# Patient Record
Sex: Female | Born: 1955 | Race: White | Hispanic: No | Marital: Single | Smoking: Light tobacco smoker
Health system: Southern US, Community
[De-identification: ages and names within clinical notes are randomized; demographics above are authoritative.]

## PROBLEM LIST (undated history)

## (undated) DIAGNOSIS — K529 Noninfective gastroenteritis and colitis, unspecified: Secondary | ICD-10-CM

## (undated) DIAGNOSIS — I10 Essential (primary) hypertension: Secondary | ICD-10-CM

## (undated) DIAGNOSIS — C801 Malignant (primary) neoplasm, unspecified: Secondary | ICD-10-CM

## (undated) DIAGNOSIS — B1920 Unspecified viral hepatitis C without hepatic coma: Secondary | ICD-10-CM

## (undated) DIAGNOSIS — E119 Type 2 diabetes mellitus without complications: Secondary | ICD-10-CM

## (undated) DIAGNOSIS — F419 Anxiety disorder, unspecified: Secondary | ICD-10-CM

## (undated) DIAGNOSIS — K219 Gastro-esophageal reflux disease without esophagitis: Secondary | ICD-10-CM

## (undated) HISTORY — DX: Essential (primary) hypertension: I10

## (undated) HISTORY — DX: Noninfective gastroenteritis and colitis, unspecified: K52.9

## (undated) HISTORY — DX: Unspecified viral hepatitis C without hepatic coma: B19.20

## (undated) HISTORY — DX: Anxiety disorder, unspecified: F41.9

## (undated) HISTORY — DX: Gastro-esophageal reflux disease without esophagitis: K21.9

## (undated) HISTORY — DX: Malignant (primary) neoplasm, unspecified: C80.1

## (undated) HISTORY — DX: Type 2 diabetes mellitus without complications: E11.9

---

## 2008-03-08 ENCOUNTER — Emergency Department: Payer: Self-pay | Admitting: Internal Medicine

## 2008-04-22 ENCOUNTER — Emergency Department (HOSPITAL_COMMUNITY): Admission: EM | Admit: 2008-04-22 | Discharge: 2008-04-22 | Payer: Self-pay | Admitting: Emergency Medicine

## 2008-05-18 ENCOUNTER — Inpatient Hospital Stay: Payer: Self-pay | Admitting: Internal Medicine

## 2008-05-19 ENCOUNTER — Ambulatory Visit: Payer: Self-pay | Admitting: Cardiology

## 2008-06-11 ENCOUNTER — Ambulatory Visit (HOSPITAL_COMMUNITY): Admission: RE | Admit: 2008-06-11 | Discharge: 2008-06-11 | Payer: Self-pay | Admitting: Family Medicine

## 2009-08-18 ENCOUNTER — Ambulatory Visit: Payer: Self-pay | Admitting: Internal Medicine

## 2009-08-26 ENCOUNTER — Ambulatory Visit: Payer: Self-pay | Admitting: Adult Health

## 2009-10-24 ENCOUNTER — Inpatient Hospital Stay: Payer: Self-pay | Admitting: Psychiatry

## 2010-12-25 ENCOUNTER — Emergency Department: Payer: Self-pay | Admitting: Emergency Medicine

## 2013-07-04 ENCOUNTER — Emergency Department: Payer: Self-pay | Admitting: Emergency Medicine

## 2014-10-24 ENCOUNTER — Ambulatory Visit: Payer: Self-pay

## 2014-11-28 HISTORY — PX: FINGER SURGERY: SHX640

## 2015-01-02 ENCOUNTER — Ambulatory Visit: Payer: Self-pay

## 2015-01-23 ENCOUNTER — Other Ambulatory Visit: Payer: Self-pay

## 2015-01-23 LAB — BASIC METABOLIC PANEL
BUN: 14 mg/dL (ref 4–21)
CREATININE: 1 mg/dL (ref 0.5–1.1)
GLUCOSE: 213 mg/dL
POTASSIUM: 4.2 mmol/L (ref 3.4–5.3)
SODIUM: 135 mmol/L — AB (ref 137–147)

## 2015-01-23 LAB — CBC AND DIFFERENTIAL
HCT: 34 % — AB (ref 36–46)
Hemoglobin: 11.3 g/dL — AB (ref 12.0–16.0)
Neutrophils Absolute: 47 /uL
Platelets: 186 10*3/uL (ref 150–399)
WBC: 7.7 10^3/mL

## 2015-01-23 LAB — HEPATIC FUNCTION PANEL
ALT: 12 U/L (ref 7–35)
AST: 14 U/L (ref 13–35)
Alkaline Phosphatase: 75 U/L (ref 25–125)
BILIRUBIN, TOTAL: 0.2 mg/dL

## 2015-01-23 LAB — LIPID PANEL
Cholesterol: 195 mg/dL (ref 0–200)
HDL: 64 mg/dL (ref 35–70)
LDL CALC: 101 mg/dL
TRIGLYCERIDES: 150 mg/dL (ref 40–160)

## 2015-01-23 LAB — HEMOGLOBIN A1C: HEMOGLOBIN A1C: 8.9

## 2015-01-30 ENCOUNTER — Ambulatory Visit: Payer: Self-pay

## 2015-03-13 ENCOUNTER — Ambulatory Visit: Payer: Self-pay

## 2015-04-29 ENCOUNTER — Ambulatory Visit: Payer: Self-pay

## 2015-05-12 DIAGNOSIS — Z794 Long term (current) use of insulin: Principal | ICD-10-CM

## 2015-05-12 DIAGNOSIS — E119 Type 2 diabetes mellitus without complications: Secondary | ICD-10-CM | POA: Insufficient documentation

## 2015-05-12 DIAGNOSIS — K219 Gastro-esophageal reflux disease without esophagitis: Secondary | ICD-10-CM | POA: Insufficient documentation

## 2015-05-12 DIAGNOSIS — B1920 Unspecified viral hepatitis C without hepatic coma: Secondary | ICD-10-CM | POA: Insufficient documentation

## 2015-05-12 DIAGNOSIS — I10 Essential (primary) hypertension: Secondary | ICD-10-CM | POA: Insufficient documentation

## 2015-05-22 ENCOUNTER — Other Ambulatory Visit: Payer: Self-pay

## 2015-05-29 ENCOUNTER — Ambulatory Visit: Payer: Self-pay

## 2015-06-26 ENCOUNTER — Ambulatory Visit: Payer: Self-pay

## 2015-10-07 ENCOUNTER — Other Ambulatory Visit: Payer: Self-pay

## 2015-10-07 DIAGNOSIS — E119 Type 2 diabetes mellitus without complications: Secondary | ICD-10-CM

## 2015-10-07 DIAGNOSIS — Z794 Long term (current) use of insulin: Secondary | ICD-10-CM

## 2015-10-07 DIAGNOSIS — B1920 Unspecified viral hepatitis C without hepatic coma: Secondary | ICD-10-CM

## 2015-10-07 DIAGNOSIS — I10 Essential (primary) hypertension: Secondary | ICD-10-CM

## 2015-10-08 LAB — HEPATIC FUNCTION PANEL
ALT: 13 IU/L (ref 0–32)
AST: 16 IU/L (ref 0–40)
Albumin: 4.2 g/dL (ref 3.6–4.8)
Alkaline Phosphatase: 68 IU/L (ref 39–117)
Bilirubin Total: <0.2 mg/dL (ref 0.0–1.2)
Bilirubin, Direct: 0.07 mg/dL (ref 0.00–0.40)
TOTAL PROTEIN: 6.9 g/dL (ref 6.0–8.5)

## 2015-10-08 LAB — LIPID PANEL
CHOL/HDL RATIO: 3.4 ratio (ref 0.0–4.4)
CHOLESTEROL TOTAL: 196 mg/dL (ref 100–199)
HDL: 57 mg/dL (ref >39)
LDL CALC: 102 mg/dL — AB (ref 0–99)
Triglycerides: 187 mg/dL — ABNORMAL HIGH (ref 0–149)
VLDL Cholesterol Cal: 37 mg/dL (ref 5–40)

## 2015-10-08 LAB — BASIC METABOLIC PANEL
BUN / CREAT RATIO: 13 (ref 12–28)
BUN: 15 mg/dL (ref 8–27)
CO2: 29 mmol/L (ref 18–29)
CREATININE: 1.12 mg/dL — AB (ref 0.57–1.00)
Calcium: 9.1 mg/dL (ref 8.7–10.3)
Chloride: 100 mmol/L (ref 96–106)
GFR calc non Af Amer: 54 mL/min/{1.73_m2} — ABNORMAL LOW (ref >59)
GFR, EST AFRICAN AMERICAN: 62 mL/min/{1.73_m2} (ref >59)
GLUCOSE: 283 mg/dL — AB (ref 65–99)
Potassium: 4.1 mmol/L (ref 3.5–5.2)
SODIUM: 139 mmol/L (ref 134–144)

## 2015-10-08 LAB — CBC WITH DIFFERENTIAL/PLATELET
BASOS ABS: 0 10*3/uL (ref 0.0–0.2)
BASOS: 0 %
EOS (ABSOLUTE): 0.2 10*3/uL (ref 0.0–0.4)
EOS: 2 %
HEMOGLOBIN: 10.6 g/dL — AB (ref 11.1–15.9)
Hematocrit: 32.3 % — ABNORMAL LOW (ref 34.0–46.6)
Immature Grans (Abs): 0 10*3/uL (ref 0.0–0.1)
Immature Granulocytes: 0 %
LYMPHS ABS: 3.1 10*3/uL (ref 0.7–3.1)
Lymphs: 41 %
MCH: 30.3 pg (ref 26.6–33.0)
MCHC: 32.8 g/dL (ref 31.5–35.7)
MCV: 92 fL (ref 79–97)
MONOCYTES: 10 %
Monocytes Absolute: 0.7 10*3/uL (ref 0.1–0.9)
NEUTROS ABS: 3.5 10*3/uL (ref 1.4–7.0)
NEUTROS PCT: 47 %
Platelets: 198 10*3/uL (ref 150–379)
RBC: 3.5 x10E6/uL — ABNORMAL LOW (ref 3.77–5.28)
RDW: 12.8 % (ref 12.3–15.4)
WBC: 7.5 10*3/uL (ref 3.4–10.8)

## 2015-10-08 LAB — HEMOGLOBIN A1C
ESTIMATED AVERAGE GLUCOSE: 235 mg/dL
HEMOGLOBIN A1C: 9.8 % — AB (ref 4.8–5.6)

## 2015-10-16 ENCOUNTER — Ambulatory Visit: Payer: Self-pay | Admitting: Urology

## 2015-10-16 VITALS — BP 127/75 | HR 72 | Wt 166.0 lb

## 2015-10-16 DIAGNOSIS — E119 Type 2 diabetes mellitus without complications: Secondary | ICD-10-CM

## 2015-10-16 DIAGNOSIS — Z794 Long term (current) use of insulin: Principal | ICD-10-CM

## 2015-10-16 LAB — GLUCOSE, POCT (MANUAL RESULT ENTRY): POC GLUCOSE: 214 mg/dL — AB (ref 70–99)

## 2015-10-16 MED ORDER — LISINOPRIL 20 MG PO TABS: 20.0000 mg | ORAL_TABLET | Freq: Every day | ORAL | Status: DC

## 2015-10-16 MED ORDER — GABAPENTIN 300 MG PO CAPS: ORAL_CAPSULE | ORAL | Status: DC

## 2015-10-16 NOTE — Progress Notes (Signed)
  Patient: Robin Moody Female    DOB: January 31, 1956   60 y.o.   MRN: [id number] Visit Date: 10/16/2015  Today's Provider: [place]   Chief Complaint  Patient presents with  . Leg Pain    left leg  . Diabetes   Subjective:    HPI Leg started to hurt one month ago.  Pain starts in the left hip and radiates down the lateral side of her leg and calf.  She describes the pain as throbbing.  Standing makes it worse.  Ibuprofen has not helped.  Stretching exerces not helping.    HbgA1c has increased. Patient admits to eating more candy at night.     No Known Allergies Previous Medications   AMLODIPINE (NORVASC) 5 MG TABLET    Take 5 mg by mouth daily.   CITALOPRAM (CELEXA) 20 MG TABLET    Take 20 mg by mouth daily.   GLIPIZIDE (GLUCOTROL) 5 MG TABLET    Take by mouth 2 (two) times daily before a meal.   INSULIN GLARGINE (LANTUS [place])    Inject 10 Units into the skin daily. Inject 10 units under the skin every evening.   LISINOPRIL (PRINIVIL,ZESTRIL) 20 MG TABLET    Take 20 mg by mouth daily.   METOPROLOL SUCCINATE (TOPROL-XL) 50 MG 24 HR TABLET    Take 50 mg by mouth 2 (two) times daily. Take with or immediately following a meal.   MIRTAZAPINE (REMERON) 30 MG TABLET    Take 30 mg by mouth at bedtime.   RANITIDINE (ZANTAC) 150 MG TABLET    Take 150 mg by mouth 2 (two) times daily.   SITAGLIPTIN (JANUVIA) 50 MG TABLET    Take 50 mg by mouth daily.    Review of Systems  Social History  Substance Use Topics  . Smoking status: Never Smoker   . Smokeless tobacco: Not on file  . Alcohol Use: Not on file   Objective:   BP 127/75 mmHg  Pulse 72  Wt 166 lb (75.297 kg)  Physical Exam      Assessment & Plan:     1. Sciatica-  Needs to see Dr. Vickki Hearing  Start gabapentin 300 mg at night     2. Anemia-  Give stool card  3. Increased creatinine  Dicussed sticking to DM diet and good sugar control  4. HTN  Patient states she has been taking her lisinopril twice daily,  refill corrected   [facility] [place] [facility]

## 2015-10-23 ENCOUNTER — Other Ambulatory Visit: Payer: Self-pay

## 2015-10-23 MED ORDER — LISINOPRIL 20 MG PO TABS: 20.0000 mg | ORAL_TABLET | Freq: Every day | ORAL | 0 refills | Status: AC

## 2015-10-29 ENCOUNTER — Encounter: Payer: Self-pay | Admitting: Specialist

## 2015-11-20 ENCOUNTER — Ambulatory Visit: Payer: Self-pay | Admitting: Nurse Practitioner

## 2015-11-20 VITALS — BP 130/79 | HR 68 | Wt 165.0 lb

## 2015-11-20 DIAGNOSIS — E119 Type 2 diabetes mellitus without complications: Secondary | ICD-10-CM

## 2015-11-20 DIAGNOSIS — E782 Mixed hyperlipidemia: Secondary | ICD-10-CM

## 2015-11-20 DIAGNOSIS — D508 Other iron deficiency anemias: Secondary | ICD-10-CM

## 2015-11-20 DIAGNOSIS — Z794 Long term (current) use of insulin: Principal | ICD-10-CM

## 2015-11-20 DIAGNOSIS — I10 Essential (primary) hypertension: Secondary | ICD-10-CM

## 2015-11-20 LAB — GLUCOSE, POCT (MANUAL RESULT ENTRY): POC GLUCOSE: 268 mg/dL — AB (ref 70–99)

## 2015-11-20 NOTE — Progress Notes (Signed)
Here tonight to determine results from stool sample   A1c elevated to 9.8 which is an average daily blood sugar of 241  States she has "cut out" the sweets, and her blood sugars are measuring much better, though in morning at or < 200  Quit smoking approx 3 years ago    Plan:    Will not change pt's diabetes regimen at this time for fear of hypoglycemia, if she makes all the necessary changes.  Will have pt keep her next appointment and labs will be drawn before her next appointment.    Will also have a scheduled appointment after 3 month labs are drawn  Will have administrative staff assist with obtaining results of the stool test, as they are  Not available in the chart

## 2015-11-24 ENCOUNTER — Other Ambulatory Visit: Payer: Self-pay | Admitting: Urology

## 2015-12-30 ENCOUNTER — Ambulatory Visit: Payer: Self-pay

## 2016-02-11 ENCOUNTER — Telehealth: Payer: Self-pay | Admitting: Pharmacist

## 2016-02-11 NOTE — Telephone Encounter (Signed)
LVM yesterday for pt and she called back this am. S/w pt regarding her Lantus. She has not picked it up from our pharmacy since April '17. She stated she only uses 10 units and keeps it in the fridge b/w doses. I explained that after 28 days she should discard the remainder. The vial are good through the date on the vial as long as they are stored in the fridge and unopened. Once the vial is punctured they expire after 28 days.  Pt v/u. She has 5 vials left in the fridge and does not need a refill at this time. She will toss current vial and start with a new one.

## 2016-02-26 ENCOUNTER — Other Ambulatory Visit: Payer: Self-pay

## 2016-03-02 ENCOUNTER — Other Ambulatory Visit: Payer: Self-pay

## 2016-03-02 DIAGNOSIS — I10 Essential (primary) hypertension: Secondary | ICD-10-CM

## 2016-03-02 DIAGNOSIS — E782 Mixed hyperlipidemia: Secondary | ICD-10-CM

## 2016-03-02 DIAGNOSIS — Z794 Long term (current) use of insulin: Secondary | ICD-10-CM

## 2016-03-02 DIAGNOSIS — E119 Type 2 diabetes mellitus without complications: Secondary | ICD-10-CM

## 2016-03-02 DIAGNOSIS — D508 Other iron deficiency anemias: Secondary | ICD-10-CM

## 2016-03-03 LAB — HEMOGLOBIN A1C
Est. average glucose Bld gHb Est-mCnc: 278 mg/dL
HEMOGLOBIN A1C: 11.3 % — AB (ref 4.8–5.6)

## 2016-03-03 LAB — CBC
Hematocrit: 33 % — ABNORMAL LOW (ref 34.0–46.6)
Hemoglobin: 10.9 g/dL — ABNORMAL LOW (ref 11.1–15.9)
MCH: 29.9 pg (ref 26.6–33.0)
MCHC: 33 g/dL (ref 31.5–35.7)
MCV: 90 fL (ref 79–97)
PLATELETS: 202 10*3/uL (ref 150–379)
RBC: 3.65 x10E6/uL — ABNORMAL LOW (ref 3.77–5.28)
RDW: 12.9 % (ref 12.3–15.4)
WBC: 7.8 10*3/uL (ref 3.4–10.8)

## 2016-03-03 LAB — LIPID PANEL
CHOLESTEROL TOTAL: 184 mg/dL (ref 100–199)
Chol/HDL Ratio: 3.1 ratio units (ref 0.0–4.4)
HDL: 60 mg/dL (ref >39)
LDL CALC: 95 mg/dL (ref 0–99)
TRIGLYCERIDES: 144 mg/dL (ref 0–149)
VLDL CHOLESTEROL CAL: 29 mg/dL (ref 5–40)

## 2016-03-03 LAB — COMPREHENSIVE METABOLIC PANEL
ALK PHOS: 74 IU/L (ref 39–117)
ALT: 12 IU/L (ref 0–32)
AST: 17 IU/L (ref 0–40)
Albumin/Globulin Ratio: 1.4 (ref 1.2–2.2)
Albumin: 4.2 g/dL (ref 3.6–4.8)
BUN/Creatinine Ratio: 20 (ref 12–28)
BUN: 18 mg/dL (ref 8–27)
CHLORIDE: 95 mmol/L — AB (ref 96–106)
CO2: 25 mmol/L (ref 18–29)
CREATININE: 0.91 mg/dL (ref 0.57–1.00)
Calcium: 9 mg/dL (ref 8.7–10.3)
GFR calc Af Amer: 79 mL/min/{1.73_m2} (ref >59)
GFR calc non Af Amer: 68 mL/min/{1.73_m2} (ref >59)
GLUCOSE: 291 mg/dL — AB (ref 65–99)
Globulin, Total: 3 g/dL (ref 1.5–4.5)
Potassium: 4.1 mmol/L (ref 3.5–5.2)
SODIUM: 136 mmol/L (ref 134–144)
Total Protein: 7.2 g/dL (ref 6.0–8.5)

## 2016-03-03 LAB — IRON AND TIBC
Iron Saturation: 15 % (ref 15–55)
Iron: 48 ug/dL (ref 27–139)
TIBC: 317 ug/dL (ref 250–450)
UIBC: 269 ug/dL (ref 118–369)

## 2016-03-03 LAB — FERRITIN: Ferritin: 80 ng/mL (ref 15–150)

## 2016-03-04 ENCOUNTER — Ambulatory Visit: Payer: Self-pay

## 2016-03-09 ENCOUNTER — Telehealth: Payer: Self-pay

## 2016-03-09 NOTE — Telephone Encounter (Signed)
Rec'd PAP application from [facility] for Lantus Vials 10 units.

## 2016-03-10 NOTE — Telephone Encounter (Signed)
Returned PAP to [facility].  Verified signatures.

## 2016-03-11 ENCOUNTER — Ambulatory Visit: Payer: Self-pay | Admitting: Urology

## 2016-03-11 VITALS — BP 132/86 | HR 76 | Temp 98.0°F | Wt 165.0 lb

## 2016-03-11 DIAGNOSIS — E119 Type 2 diabetes mellitus without complications: Secondary | ICD-10-CM

## 2016-03-11 DIAGNOSIS — Z794 Long term (current) use of insulin: Principal | ICD-10-CM

## 2016-03-11 LAB — GLUCOSE, POCT (MANUAL RESULT ENTRY): POC GLUCOSE: 238 mg/dL — AB (ref 70–99)

## 2016-03-11 MED ORDER — GABAPENTIN 300 MG PO CAPS: ORAL_CAPSULE | ORAL | 0 refills | Status: DC

## 2016-03-11 NOTE — Progress Notes (Signed)
  Patient: Robin Moody Female    DOB: Aug 20, 1955   60 y.o.   MRN: [id number] Visit Date: 03/11/2016  Today's Provider: [place]   Chief Complaint  Patient presents with  . Diabetes    fu lab results. have questions about med.    Subjective:    HPI Patient is taking 10 U of lantus in the evenings, glipizide '5mg'$  bid and januvia 50 mg daily.  She is not consistent with checking her BS at home.  She states that she has had sugars as high as 400 and as low as 50.  She has the material to check her sugars twice daily.    She states that when her sugars were 50, she felt drunk.  She states that she has not been compliant with her diet as she is not very hungry.     Stool card results are not available.     No Known Allergies Previous Medications   AMLODIPINE (NORVASC) 5 MG TABLET    Take 5 mg by mouth daily.   CITALOPRAM (CELEXA) 20 MG TABLET    Take 20 mg by mouth daily.   GABAPENTIN (NEURONTIN) 300 MG CAPSULE    TAKE ONE CAPSULE BY MOUTH EVERY NIGHT.   GLIPIZIDE (GLUCOTROL) 5 MG TABLET    Take by mouth 2 (two) times daily before a meal.   INSULIN GLARGINE (LANTUS [place])    Inject 10 Units into the skin daily. Inject 10 units under the skin every evening.   LISINOPRIL (PRINIVIL,ZESTRIL) 20 MG TABLET    Take 1 tablet (20 mg total) by mouth daily.   METOPROLOL SUCCINATE (TOPROL-XL) 50 MG 24 HR TABLET    Take 50 mg by mouth 2 (two) times daily. Take with or immediately following a meal.   MIRTAZAPINE (REMERON) 30 MG TABLET    Take 30 mg by mouth at bedtime.   RANITIDINE (ZANTAC) 150 MG TABLET    Take 150 mg by mouth 2 (two) times daily.   SITAGLIPTIN (JANUVIA) 50 MG TABLET    Take 50 mg by mouth daily.    Review of Systems  Social History  Substance Use Topics  . Smoking status: Never Smoker  . Smokeless tobacco: Not on file  . Alcohol use Not on file   Objective:   BP 132/86   Pulse 76   Temp 98 F (36.7 C) (Oral)   Wt 165 lb (74.8 kg)   LMP 01/09/2001 (Within  Months)   Physical Exam Constitutional: Well nourished. Alert and oriented, No acute distress. HEENT:  AT, moist mucus membranes. Trachea midline, no masses. Cardiovascular: No clubbing, cyanosis, or edema. Respiratory: Normal respiratory effort, no increased work of breathing. Skin: No rashes, bruises or suspicious lesions. Lymph: No cervical or inguinal adenopathy. Neurologic: Grossly intact, no focal deficits, moving all 4 extremities. Psychiatric: Normal mood and affect.      Assessment & Plan:  1. DM  - not controlled  - patient will not make any changes with medications at this time  - she will check BS bid and record food intake and RTC in one week for review  2. Decrease in HCT  - repeat stool card         [facility] [place]

## 2016-03-18 ENCOUNTER — Ambulatory Visit: Payer: Self-pay

## 2016-03-25 ENCOUNTER — Other Ambulatory Visit: Payer: Self-pay | Admitting: Ophthalmology

## 2016-03-25 ENCOUNTER — Ambulatory Visit: Payer: Self-pay | Admitting: Ophthalmology

## 2016-03-25 LAB — HM DIABETES EYE EXAM

## 2016-04-02 NOTE — Telephone Encounter (Signed)
Lantus Vials (Inject 10 units every evening) PAP submitted to manufacturer today.

## 2016-04-08 ENCOUNTER — Ambulatory Visit: Payer: Self-pay | Admitting: Nurse Practitioner

## 2016-04-08 VITALS — BP 152/92 | HR 74 | Temp 97.9°F | Wt 164.0 lb

## 2016-04-08 DIAGNOSIS — N3000 Acute cystitis without hematuria: Secondary | ICD-10-CM

## 2016-04-08 DIAGNOSIS — E119 Type 2 diabetes mellitus without complications: Secondary | ICD-10-CM

## 2016-04-08 LAB — GLUCOSE, POCT (MANUAL RESULT ENTRY): POC Glucose: 337 mg/dl — AB (ref 70–99)

## 2016-04-08 MED ORDER — METFORMIN HCL 500 MG PO TABS: ORAL_TABLET | ORAL | 0 refills | Status: DC

## 2016-04-08 MED ORDER — CIPROFLOXACIN HCL 500 MG PO TABS: 500.0000 mg | ORAL_TABLET | Freq: Two times a day (BID) | ORAL | 0 refills | Status: AC

## 2016-04-08 NOTE — Progress Notes (Signed)
Began today for dysuria  Urgency and frequency.  Has drunk a lot of water, but believes she also needs an antibiotic  Pt is out of her lisinopril.    Last a1c in dec was 11.3 or qd blood sugar of 291     Plan:  Will treat for uti presumptively and prescribe cipro  Diabetes, under very poor control  Will have pt increase evening lantius dose by one unit when each a.m. Blood sugar is > 200. Will restart metformin at 500 mg each evening and will have pt check cbg bid.    Will see pt back in 2-3 weeks

## 2016-04-29 ENCOUNTER — Ambulatory Visit: Payer: Self-pay

## 2016-05-06 ENCOUNTER — Telehealth: Payer: Self-pay | Admitting: Pharmacist

## 2016-05-06 NOTE — Telephone Encounter (Signed)
[place] for PAP refill today.

## 2016-05-13 ENCOUNTER — Ambulatory Visit: Payer: Self-pay

## 2016-05-28 ENCOUNTER — Other Ambulatory Visit: Payer: Self-pay | Admitting: Urology

## 2016-06-01 ENCOUNTER — Telehealth: Payer: Self-pay

## 2016-06-01 NOTE — Telephone Encounter (Signed)
Received PAP application from [facility] for Lantus placed for provider to sign. 

## 2016-06-17 ENCOUNTER — Telehealth: Payer: Self-pay

## 2016-06-17 NOTE — Telephone Encounter (Signed)
Placed signed application/script in [facility] folder for pickup. 

## 2016-06-17 NOTE — Telephone Encounter (Signed)
Received PAP application from [facility] for Lantus for provider to sign.

## 2016-06-21 ENCOUNTER — Other Ambulatory Visit: Payer: Self-pay | Admitting: Internal Medicine

## 2016-07-01 ENCOUNTER — Telehealth: Payer: Self-pay | Admitting: Pharmacist

## 2016-07-01 NOTE — Telephone Encounter (Signed)
07/01/16 Faxed refill request to [employer] for Lantus Vials (inject 10 units under the skin every evening)

## 2016-07-06 ENCOUNTER — Telehealth: Payer: Self-pay | Admitting: Pharmacist

## 2016-07-06 NOTE — Telephone Encounter (Signed)
07/06/2016 [place] for refill on Onglyza 2.'5mg'$ , allow 7-10 days to receive.

## 2016-09-21 ENCOUNTER — Telehealth: Payer: Self-pay | Admitting: Pharmacist

## 2016-09-21 ENCOUNTER — Telehealth: Payer: Self-pay

## 2016-09-21 NOTE — Telephone Encounter (Signed)
09/21/16 [place] for refill on Onglyza 2.5mg .Delos Haring

## 2016-09-21 NOTE — Telephone Encounter (Signed)
Received PAP application from [facility] for Lantus placed for provider to sign.

## 2016-09-23 ENCOUNTER — Telehealth: Payer: Self-pay | Admitting: Pharmacy Technician

## 2016-09-23 NOTE — Telephone Encounter (Signed)
Patient eligible to receive medication assistance at [facility] until 05/27/17 as long as eligibility requirements continue to be met.  Bodie Abernethy J. Tearia Gibbs Care Manager [facility] 

## 2016-10-25 ENCOUNTER — Telehealth: Payer: Self-pay | Admitting: Pharmacist

## 2016-10-25 NOTE — Telephone Encounter (Signed)
10/25/16 Faxed refill request to [employer] for Lantus Vials Inject 10 units under the skin every evening, # 4.Delos Haring

## 2017-01-06 ENCOUNTER — Telehealth: Payer: Self-pay

## 2017-01-06 NOTE — Telephone Encounter (Signed)
Received PAP application from [facility] for lantus placed for provider to sign.

## 2017-01-06 NOTE — Telephone Encounter (Signed)
Placed signed application/script in [facility] folder for pickup.

## 2017-01-18 ENCOUNTER — Telehealth: Payer: Self-pay | Admitting: Pharmacist

## 2017-01-18 NOTE — Telephone Encounter (Signed)
01/18/17 Faxed [employer] a refill request for Lantus Vials Inject 10 units into the skin every evening #4.Delos Haring

## 2017-03-13 ENCOUNTER — Emergency Department
Admission: EM | Admit: 2017-03-13 | Discharge: 2017-03-13 | Disposition: A | Discharge status: Home / Self Care | Payer: Self-pay | Attending: Emergency Medicine | Admitting: Emergency Medicine

## 2017-03-13 ENCOUNTER — Encounter: Payer: Self-pay | Admitting: Emergency Medicine

## 2017-03-13 ENCOUNTER — Other Ambulatory Visit: Payer: Self-pay

## 2017-03-13 DIAGNOSIS — R21 Rash and other nonspecific skin eruption: Secondary | ICD-10-CM | POA: Insufficient documentation

## 2017-03-13 DIAGNOSIS — Z7984 Long term (current) use of oral hypoglycemic drugs: Secondary | ICD-10-CM | POA: Insufficient documentation

## 2017-03-13 DIAGNOSIS — I10 Essential (primary) hypertension: Secondary | ICD-10-CM | POA: Insufficient documentation

## 2017-03-13 DIAGNOSIS — E119 Type 2 diabetes mellitus without complications: Secondary | ICD-10-CM | POA: Insufficient documentation

## 2017-03-13 DIAGNOSIS — Z79899 Other long term (current) drug therapy: Secondary | ICD-10-CM | POA: Insufficient documentation

## 2017-03-13 LAB — GLUCOSE, CAPILLARY: Glucose-Capillary: 91 mg/dL (ref 65–99)

## 2017-03-13 MED ORDER — MUPIROCIN CALCIUM 2 % EX CREA: 1.0000 "application " | TOPICAL_CREAM | Freq: Two times a day (BID) | CUTANEOUS | 0 refills | Status: DC

## 2017-03-13 NOTE — ED Triage Notes (Signed)
Pt presents to ED c/o 3 single lesions to R cheek. Pt denies pain or itching. States rash started as bumps without redness, now appear to have crusted over with redness.

## 2017-03-13 NOTE — Discharge Instructions (Signed)
Begin using Bactroban cream to the area 3 times a day. Tylenol if needed for pain. Follow-up with your primary care doctor if any continued problems. Return to the emergency department if any severe worsening of her symptoms such as fever or drainage.

## 2017-03-13 NOTE — ED Notes (Signed)
See triage note  Presents with 3 small areas of rash to face. Area was red at first now area is crusted

## 2017-03-13 NOTE — ED Provider Notes (Signed)
[facility] Emergency Department Provider Note  ___________________________________________   First MD Initiated Contact with Patient 03/13/17 1322     (approximate)  I have reviewed the triage vital signs and the nursing notes.   HISTORY  Chief Complaint No chief complaint on file.   HPI Robin Moody is a 61 y.o. female for complaint of 3 single lesions to her right cheek that began almost a week ago. Patient states that initially she had bumps without redness At no time were they vesicles. She denies any pain or burning to this area. There is been no drainage and she denies fever.   Past Medical History:  Diagnosis Date  . Anxiety   . Chronic diarrhea Noted at 04/29/15 [facility] Visit   Pt reports having it for 5 years.  . Diabetes mellitus without complication ([place])   . GERD (gastroesophageal reflux disease)   . Hepatitis C   . Hypertension     Patient Active Problem List   Diagnosis Date Noted  . Diabetes ([place]) 05/12/2015  . Essential hypertension 05/12/2015  . GERD (gastroesophageal reflux disease) 05/12/2015  . Hepatitis C 05/12/2015    Past Surgical History:  Procedure Laterality Date  . FINGER SURGERY  Sept 2016   Surgical repair of 4th digit on left hand    Prior to Admission medications   Medication Sig Start Date End Date Taking? Authorizing Provider  amLODipine (NORVASC) 5 MG tablet TAKE ONE TABLET BY MOUTH EVERY DAY 06/01/16   McGowan, Larene Beach A, PA-C  citalopram (CELEXA) 20 MG tablet Take 20 mg by mouth daily.    [provider]  gabapentin (NEURONTIN) 300 MG capsule TAKE ONE CAPSULE BY MOUTH EVERY NIGHT. 03/11/16   Zara Council A, PA-C  glipiZIDE (GLUCOTROL) 5 MG tablet Take by mouth 2 (two) times daily before a meal.    [provider]  Insulin Glargine (LANTUS [place]) Inject 10 Units into the skin daily. Inject 10 units under the skin every evening, each time morning blood sugar is greater than 200  add one unit of lantus.     [provider]  lisinopril (PRINIVIL,ZESTRIL) 20 MG tablet Take 1 tablet (20 mg total) by mouth daily. 10/23/15   Boyce Medici, FNP  metFORMIN (GLUCOPHAGE) 500 MG tablet One tablet each day with evening meal. 04/08/16   Odem, [place] Breeze, FNP  metoprolol (LOPRESSOR) 50 MG tablet TAKE ONE TABLET BY MOUTH 2 TIMES A DAY 06/01/16   McGowan, Larene Beach A, PA-C  metoprolol succinate (TOPROL-XL) 50 MG 24 hr tablet Take 50 mg by mouth 2 (two) times daily. Take with or immediately following a meal.    [provider]  mirtazapine (REMERON) 30 MG tablet Take 30 mg by mouth at bedtime.    [provider]  mupirocin cream (BACTROBAN) 2 % Apply 1 application topically 2 (two) times daily. 03/13/17   Johnn Hai, PA-C  ranitidine (ZANTAC) 150 MG tablet TAKE ONE TABLET BY MOUTH 2 TIMES A DAY 06/01/16   McGowan, Larene Beach A, PA-C  sitaGLIPtin (JANUVIA) 50 MG tablet Take 50 mg by mouth daily.    [provider]    Allergies Patient has no known allergies.  History reviewed. No pertinent family history.  Social History Social History   Tobacco Use  . Smoking status: Never Smoker  . Smokeless tobacco: Never Used  Substance Use Topics  . Alcohol use: No    Frequency: Never  . Drug use: No  Review of Systems Constitutional: No fever/chills Cardiovascular: Denies chest pain. Respiratory: Denies shortness of breath. Gastrointestinal:   No nausea, no vomiting.   Musculoskeletal: Negative for back pain. Skin: positive for skin eruption. Neurological: Negative for headaches, focal weakness or numbness. ____________________________________________   PHYSICAL EXAM:  VITAL SIGNS: ED Triage Vitals [03/13/17 1243]  Enc Vitals Group     BP (!) 153/80     Pulse Rate 86     Resp 18     Temp 98.4 F (36.9 C)     Temp Source Oral     SpO2 97 %     Weight 162 lb (73.5 kg)     Height 5\' 4"  (1.626 m)     Head Circumference      Peak Flow       Pain Score      Pain Loc      Pain Edu?      Excl. in [place]?    Constitutional: Alert and oriented. Well appearing and in no acute distress. Eyes: Conjunctivae are normal.  Head: Atraumatic. Nose: No congestion/rhinnorhea. Mouth/Throat: Mucous membranes are moist.  Oropharynx non-erythematous. Neck: No stridor.   Hematological/Lymphatic/Immunilogical: No cervical lymphadenopathy. Cardiovascular: Normal rate, regular rhythm. Grossly normal heart sounds.  Good peripheral circulation. Respiratory: Normal respiratory effort.  No retractions. Lungs CTAB. Musculoskeletal: moves upper and lower extremities without any difficulty. Normal gait was noted. Neurologic:  Normal speech and language. No gross focal neurologic deficits are appreciated. No gait instability. Skin:  Skin is warm, dry. Here are 3 individual circular lesions on the right side of the face without any involvement posterior scalp or neck. No drainage is noted. There is no tenderness on palpation. No warmth or soft tissue swelling is appreciated. Psychiatric: Mood and affect are normal. Speech and behavior are normal.  ____________________________________________   LABS (all labs ordered are listed, but only abnormal results are displayed)  Labs Reviewed  GLUCOSE, CAPILLARY   PROCEDURES  Procedure(s) performed: None  Procedures  Critical Care performed: No  ____________________________________________   INITIAL IMPRESSION / ASSESSMENT AND PLAN / ED COURSE Patient was given a prescription for Bactroban cream to apply to area 3 times a day and to follow-up with her PCP if any continued problems. Patient is reassured that this does not appear to be shingles.  ____________________________________________   FINAL CLINICAL IMPRESSION(S) / ED DIAGNOSES  Final diagnoses:  Rash and nonspecific skin eruption     ED Discharge Orders        Ordered    mupirocin cream (BACTROBAN) 2 %  2 times daily     03/13/17 1405         Note:  This document was prepared using Dragon voice recognition software and may include unintentional dictation errors.    Johnn Hai, PA-C 03/13/17 1645    Lisa Roca, MD 03/17/17 [phone]

## 2017-03-13 NOTE — ED Notes (Signed)
Checked pt CBG per family request. Hx DMT2, reports compliance with meds. CBG 91.

## 2017-04-22 ENCOUNTER — Telehealth: Payer: Self-pay | Admitting: Pharmacist

## 2017-04-22 NOTE — Telephone Encounter (Signed)
04/22/17 Faxed [employer] application for LantusVials Inject 10 units under the skin every evening # 4. Delos Haring

## 2017-04-25 ENCOUNTER — Telehealth: Payer: Self-pay | Admitting: Pharmacist

## 2017-04-25 NOTE — Telephone Encounter (Signed)
04/25/17 Received approval letter from [employer] for Lantus Vials, patient approved till 04/24/2018.Robin Moody

## 2017-05-30 ENCOUNTER — Telehealth: Payer: Self-pay | Admitting: Pharmacy Technician

## 2017-05-30 NOTE — Telephone Encounter (Signed)
Received updated proof of income.  Patient eligible to receive medication assistance at [facility] through 2019, as long as eligibility requirements continue to be met.  [place] [facility]

## 2017-06-13 ENCOUNTER — Other Ambulatory Visit: Payer: Self-pay

## 2017-06-13 ENCOUNTER — Ambulatory Visit: Payer: Self-pay | Admitting: Pharmacist

## 2017-06-13 VITALS — BP 132/72 | Wt 161.0 lb

## 2017-06-13 DIAGNOSIS — Z79899 Other long term (current) drug therapy: Secondary | ICD-10-CM

## 2017-06-13 NOTE — Progress Notes (Signed)
  [facility] Visit Note  Patient: Robin Moody MRN: [id number] Date of Birth: [birth year] PCP: Gale Journey, MD   Robin Moody 62 y.o. female presents for a medication therapy management follow up visit today with the pharmacy resident.   BP 132/72   Wt 161 lb (73 kg)   LMP 01/09/2001 (Within Months)   BMI 27.64 kg/m   Patient Information   Past Medical History:  Diagnosis Date  . Anxiety   . Chronic diarrhea Noted at 04/29/15 [facility] Visit   Pt reports having it for 5 years.  . Diabetes mellitus without complication ([place])   . GERD (gastroesophageal reflux disease)   . Hepatitis C   . Hypertension       Past Surgical History:  Procedure Laterality Date  . FINGER SURGERY  Sept 2016   Surgical repair of 4th digit on left hand    No family history on file.  New Diagnoses (since last visit):   Exercise: Patient walks on treadmill  Diet:  Breakfast:banana, yogurt/granola Lunch:salad with meat, cheese and low-fat dressing  Dinner:Chinese  Drinks:diet mt dew and water      Social History   Substance and Sexual Activity  Alcohol Use No  . Frequency: Never      Social History   Tobacco Use  Smoking Status Current Every Day Smoker  . Types: Cigarettes  Smokeless Tobacco Never Used  Tobacco Comment   1 cigarette/day       Health Maintenance  Topic Date Due  . PNEUMOCOCCAL POLYSACCHARIDE VACCINE (1) 01/04/1957  . FOOT EXAM  01/04/1965  . HIV Screening  01/04/1970  . TETANUS/TDAP  01/04/1974  . PAP SMEAR  01/05/1976  . COLONOSCOPY  01/04/2005  . MAMMOGRAM  06/12/2010  . HEMOGLOBIN A1C  08/31/2016  . OPHTHALMOLOGY EXAM  03/25/2017  . INFLUENZA VACCINE  Completed  . Hepatitis C Screening  Completed     Assessment and Plan:  1. Medication compliance: good; patient states uses pill box and refills weekly. Rarely misses a dose. Patient was taking metoprolol succinate 50mg  twice daily but reviewed to correctly take two  50mg  tablets once daily.   2. DM2: Uncontrolled, although a1c significantly decreased from 11.3 to 8.4% within a year (A1c 8.3% 01/2017). Currently on Lantus and Victoza. Patient states rarely experiences hypoglycemic episodes--reviewed what to do when experiencing low blood sugars (1/2 cup OJ or 2-3 glucose tablets and recheck BG 15 min later. Eat meal with protein once normalized). Checks blood sugars in mornings and evenings before dinner. AM sugars around 150-160 mg/dL. Reviewed daily diet, overall looks good with diet sodas and water for drinks. Has stopped taking gabapentin for sciatica/diabetic neuropathy and states she has been doing fine without it. On a low-intensity statin, but would recommend increasing to high-intensity statin because of diabetes and CV risk.   3. HTN: Controlled <140/90. BP today 132/72. Currently on amlodipine, lisinopril, metoprolol succinate. Continue current therapy.   4. Smoker: Patient states smokes ~1 cig every evening. Knows she should stop and feels that she can quit cold-turkey when she wants to. 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   Candelaria Stagers, PharmD Pharmacy Resident

## 2017-06-23 ENCOUNTER — Telehealth: Payer: Self-pay | Admitting: Pharmacist

## 2017-06-23 NOTE — Telephone Encounter (Signed)
06/23/2017 11:05:15 AM - Lantus Vials refill  06/23/17 Taking [employer] refill request to [facility] for provider to sign--Lantus Vials Inject 10 units under the skin every evening.Delos Haring

## 2017-07-06 ENCOUNTER — Telehealth: Payer: Self-pay | Admitting: Pharmacist

## 2017-07-06 NOTE — Telephone Encounter (Signed)
07/06/2017 9:36:55 AM - Lantus Vials refill  07/06/17 Faxed [employer] refill request for Lantus Vials Inject 10 units under the skin every evening.Delos Haring

## 2017-09-21 ENCOUNTER — Telehealth: Payer: Self-pay | Admitting: Pharmacist

## 2017-09-21 NOTE — Telephone Encounter (Signed)
09/21/2017 3:49:29 PM - Lantus Vials refill  09/21/17 Sending [employer] refill request to [facility] to refill Lantus Vials Inject 10 units under the skin every evening.Robin Moody

## 2017-10-07 ENCOUNTER — Telehealth: Payer: Self-pay | Admitting: Pharmacist

## 2017-10-07 NOTE — Telephone Encounter (Signed)
10/07/2017 9:19:06 AM - Lantus Vials refill  10/07/17 Faxed [employer] refill request for Lantus Vials Inject 10 units under the skin every evening.Delos Haring

## 2017-11-30 ENCOUNTER — Other Ambulatory Visit: Payer: Self-pay | Admitting: Internal Medicine

## 2017-12-26 ENCOUNTER — Telehealth: Payer: Self-pay | Admitting: Pharmacist

## 2017-12-26 NOTE — Telephone Encounter (Signed)
12/26/2017 3:58:34 PM - Lantus Vials refill  12/26/17 Sending [employer] refill request for Lantus Vials to [facility] for provider to sign.Delos Haring

## 2018-01-26 ENCOUNTER — Telehealth: Payer: Self-pay | Admitting: Pharmacist

## 2018-01-26 NOTE — Telephone Encounter (Signed)
01/26/2018 9:35:13 AM - Lantus Vials refill  01/26/18 Faxed [employer] refill request for Lantus Vials Inject 10 units once every evening #4.Delos Haring

## 2018-05-23 ENCOUNTER — Telehealth: Payer: Self-pay | Admitting: Pharmacist

## 2018-05-23 NOTE — Telephone Encounter (Signed)
05/23/2018 12:34:42 PM - Lantus Vials refill  05/23/2018 Faxed [employer] refill request for Lantus Vials Inject 35 units every evening. Patient is enrolled with [employer] till 02/27/2019.Robin Moody

## 2018-05-30 ENCOUNTER — Telehealth: Payer: Self-pay | Admitting: Pharmacy Technician

## 2018-05-30 NOTE — Telephone Encounter (Signed)
Received 2020 proof of income.  Patient eligible to receive medication assistance at [facility] as long as eligibility requirements continue to be met.  [place] [facility]

## 2018-06-14 ENCOUNTER — Telehealth: Payer: Self-pay | Admitting: Pharmacist

## 2018-06-14 NOTE — Telephone Encounter (Signed)
Spoke with patient regarding performing her MTM over the phone rather than in person in light of the current COVID situation. Prefers to come in as she needs to pick up refills.   Robin Moody, PharmD Pharmacy Resident  06/14/2018 3:20 PM

## 2018-06-15 ENCOUNTER — Encounter: Payer: Self-pay | Admitting: Pharmacist

## 2018-06-15 ENCOUNTER — Ambulatory Visit: Payer: Self-pay | Admitting: Pharmacist

## 2018-06-15 ENCOUNTER — Other Ambulatory Visit: Payer: Self-pay

## 2018-06-15 DIAGNOSIS — Z79899 Other long term (current) drug therapy: Secondary | ICD-10-CM

## 2018-06-15 NOTE — Progress Notes (Signed)
  Medication Management Clinic Visit Note  Patient: Robin Moody MRN: [id number] Date of Birth: 03/21/1956 PCP: Robin Journey, MD   Robin Moody 63 y.o. female presents for a Medication Management visit today.  LMP 01/09/2001 (Within Months)   Patient Information   Past Medical History:  Diagnosis Date  . Anxiety   . Chronic diarrhea Noted at 04/29/15 [facility] Visit   Pt reports having it for 5 years.  . Diabetes mellitus without complication ([place])   . GERD (gastroesophageal reflux disease)   . Hepatitis C   . Hypertension       Past Surgical History:  Procedure Laterality Date  . FINGER SURGERY  Sept 2016   Surgical repair of 4th digit on left hand    No family history on file.  New Diagnoses (since last visit):   Family Support: Good  Lifestyle Diet: Breakfast:egg and cheese sandwich, yogurt, cereal Lunch: sandwich Dinner: meat and veggies Drinks:Diet Mt. Dew            Social History   Substance and Sexual Activity  Alcohol Use No  . Frequency: Never      Social History   Tobacco Use  Smoking Status Current Every Day Smoker  . Types: Cigarettes  Smokeless Tobacco Never Used  Tobacco Comment   1 cigarette/day       Health Maintenance  Topic Date Due  . FOOT EXAM  01/04/1965  . HIV Screening  01/04/1970  . TETANUS/TDAP  01/04/1974  . PAP SMEAR-Modifier  01/05/1976  . COLONOSCOPY  01/04/2005  . MAMMOGRAM  06/12/2010  . HEMOGLOBIN A1C  08/31/2016  . OPHTHALMOLOGY EXAM  03/25/2017  . INFLUENZA VACCINE  10/27/2017  . PNEUMOCOCCAL POLYSACCHARIDE VACCINE AGE 32-64 HIGH RISK  Completed  . Hepatitis C Screening  Completed     Assessment and Plan: 1. Hypertension (Amlodipine, Metoprolol): checks 130/65 during chemo. States doctor took her off lisinopril when she started chemo. Patient did not want BP checked today.  2. Diabetes (lantus): checks BG 100-150 in morning, post-prandial ~200s. Stated this is higher than goal, but  patient has recently lost ~20 lbs and has been taken off multiple diabetic medications as a result of chemotherapy.  3. 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 Was unable to get current cycle due to thrombocytopenia. Urged patient to limit contact with other people right now as much as possible due to risk of infection. 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  4. Smoking Cessation: was a current everyday smoker, but now smokes ~1 cigarette/week due to cancer diagnosis. Is not ready to fully quit at this time, but is planning to do so soon.  5. GERD (famotidine OTC in evening pantoprazole in AM): no issues   Robin Moody, PharmD Pharmacy Resident  06/15/2018 11:40 AM

## 2018-09-14 ENCOUNTER — Telehealth: Payer: Self-pay | Admitting: Pharmacy Technician

## 2018-09-14 NOTE — Telephone Encounter (Signed)
Patient has Medicaid to cover her medications.  Patient no longer meets [facility]'s eligibility criteria.  Patient acknowledged that she understood that [facility] could no longer provide medication assistance.  [facility]

## 2018-10-30 ENCOUNTER — Other Ambulatory Visit: Payer: Self-pay

## 2018-10-30 DIAGNOSIS — Z20822 Contact with and (suspected) exposure to covid-19: Secondary | ICD-10-CM

## 2018-11-01 LAB — NOVEL CORONAVIRUS, NAA: SARS-CoV-2, NAA: NOT DETECTED

## 2018-11-07 ENCOUNTER — Emergency Department: Payer: Medicaid Other

## 2018-11-07 ENCOUNTER — Encounter: Payer: Self-pay | Admitting: Emergency Medicine

## 2018-11-07 ENCOUNTER — Inpatient Hospital Stay
Admission: EM | Admit: 2018-11-07 | Discharge: 2018-11-08 | DRG: 123 | Disposition: A | Discharge status: Home / Self Care | Payer: Medicaid Other | Attending: Internal Medicine | Admitting: Internal Medicine

## 2018-11-07 ENCOUNTER — Other Ambulatory Visit: Payer: Self-pay

## 2018-11-07 DIAGNOSIS — H02402 Unspecified ptosis of left eyelid: Secondary | ICD-10-CM | POA: Diagnosis present

## 2018-11-07 DIAGNOSIS — F419 Anxiety disorder, unspecified: Secondary | ICD-10-CM | POA: Diagnosis present

## 2018-11-07 DIAGNOSIS — Z79899 Other long term (current) drug therapy: Secondary | ICD-10-CM | POA: Diagnosis not present

## 2018-11-07 DIAGNOSIS — H02409 Unspecified ptosis of unspecified eyelid: Secondary | ICD-10-CM

## 2018-11-07 DIAGNOSIS — R29810 Facial weakness: Secondary | ICD-10-CM | POA: Diagnosis present

## 2018-11-07 DIAGNOSIS — E785 Hyperlipidemia, unspecified: Secondary | ICD-10-CM | POA: Diagnosis present

## 2018-11-07 DIAGNOSIS — Z794 Long term (current) use of insulin: Secondary | ICD-10-CM

## 2018-11-07 DIAGNOSIS — E119 Type 2 diabetes mellitus without complications: Secondary | ICD-10-CM | POA: Diagnosis present

## 2018-11-07 DIAGNOSIS — Z8619 Personal history of other infectious and parasitic diseases: Secondary | ICD-10-CM | POA: Diagnosis not present

## 2018-11-07 DIAGNOSIS — I10 Essential (primary) hypertension: Secondary | ICD-10-CM | POA: Diagnosis present

## 2018-11-07 DIAGNOSIS — Z9221 Personal history of antineoplastic chemotherapy: Secondary | ICD-10-CM

## 2018-11-07 DIAGNOSIS — Z8249 Family history of ischemic heart disease and other diseases of the circulatory system: Secondary | ICD-10-CM | POA: Diagnosis not present

## 2018-11-07 DIAGNOSIS — C3490 Malignant neoplasm of unspecified part of unspecified bronchus or lung: Secondary | ICD-10-CM | POA: Diagnosis present

## 2018-11-07 DIAGNOSIS — E871 Hypo-osmolality and hyponatremia: Secondary | ICD-10-CM | POA: Diagnosis present

## 2018-11-07 DIAGNOSIS — F17210 Nicotine dependence, cigarettes, uncomplicated: Secondary | ICD-10-CM | POA: Diagnosis present

## 2018-11-07 DIAGNOSIS — Z20828 Contact with and (suspected) exposure to other viral communicable diseases: Secondary | ICD-10-CM | POA: Diagnosis present

## 2018-11-07 DIAGNOSIS — K219 Gastro-esophageal reflux disease without esophagitis: Secondary | ICD-10-CM | POA: Diagnosis present

## 2018-11-07 DIAGNOSIS — Z85118 Personal history of other malignant neoplasm of bronchus and lung: Secondary | ICD-10-CM | POA: Diagnosis not present

## 2018-11-07 LAB — HEMOGLOBIN A1C
Hgb A1c MFr Bld: 9.7 % — ABNORMAL HIGH (ref 4.8–5.6)
Mean Plasma Glucose: 231.69 mg/dL

## 2018-11-07 LAB — URINALYSIS, COMPLETE (UACMP) WITH MICROSCOPIC
Bacteria, UA: NONE SEEN
Bilirubin Urine: NEGATIVE
Glucose, UA: 50 mg/dL — AB
Hgb urine dipstick: NEGATIVE
Ketones, ur: NEGATIVE mg/dL
Nitrite: NEGATIVE
Protein, ur: NEGATIVE mg/dL
Specific Gravity, Urine: 1.009 (ref 1.005–1.030)
Squamous Epithelial / HPF: NONE SEEN (ref 0–5)
pH: 7 (ref 5.0–8.0)

## 2018-11-07 LAB — CBC
HCT: 30.2 % — ABNORMAL LOW (ref 36.0–46.0)
Hemoglobin: 10.4 g/dL — ABNORMAL LOW (ref 12.0–15.0)
MCH: 33.1 pg (ref 26.0–34.0)
MCHC: 34.4 g/dL (ref 30.0–36.0)
MCV: 96.2 fL (ref 80.0–100.0)
Platelets: 124 10*3/uL — ABNORMAL LOW (ref 150–400)
RBC: 3.14 MIL/uL — ABNORMAL LOW (ref 3.87–5.11)
RDW: 12.3 % (ref 11.5–15.5)
WBC: 5.6 10*3/uL (ref 4.0–10.5)
nRBC: 0 % (ref 0.0–0.2)

## 2018-11-07 LAB — COMPREHENSIVE METABOLIC PANEL
ALT: 15 U/L (ref 0–44)
AST: 18 U/L (ref 15–41)
Albumin: 4 g/dL (ref 3.5–5.0)
Alkaline Phosphatase: 65 U/L (ref 38–126)
Anion gap: 9 (ref 5–15)
BUN: 24 mg/dL — ABNORMAL HIGH (ref 8–23)
CO2: 28 mmol/L (ref 22–32)
Calcium: 9.4 mg/dL (ref 8.9–10.3)
Chloride: 89 mmol/L — ABNORMAL LOW (ref 98–111)
Creatinine, Ser: 1.04 mg/dL — ABNORMAL HIGH (ref 0.44–1.00)
GFR calc Af Amer: >60 mL/min (ref >60)
GFR calc non Af Amer: 57 mL/min — ABNORMAL LOW (ref >60)
Glucose, Bld: 190 mg/dL — ABNORMAL HIGH (ref 70–99)
Potassium: 4.3 mmol/L (ref 3.5–5.1)
Sodium: 126 mmol/L — ABNORMAL LOW (ref 135–145)
Total Bilirubin: 0.4 mg/dL (ref 0.3–1.2)
Total Protein: 7.3 g/dL (ref 6.5–8.1)

## 2018-11-07 LAB — GLUCOSE, CAPILLARY
Glucose-Capillary: 190 mg/dL — ABNORMAL HIGH (ref 70–99)
Glucose-Capillary: 219 mg/dL — ABNORMAL HIGH (ref 70–99)

## 2018-11-07 LAB — DIFFERENTIAL
Abs Immature Granulocytes: 0.04 10*3/uL (ref 0.00–0.07)
Basophils Absolute: 0 10*3/uL (ref 0.0–0.1)
Basophils Relative: 0 %
Eosinophils Absolute: 0.1 10*3/uL (ref 0.0–0.5)
Eosinophils Relative: 1 %
Immature Granulocytes: 1 %
Lymphocytes Relative: 21 %
Lymphs Abs: 1.2 10*3/uL (ref 0.7–4.0)
Monocytes Absolute: 0.6 10*3/uL (ref 0.1–1.0)
Monocytes Relative: 10 %
Neutro Abs: 3.7 10*3/uL (ref 1.7–7.7)
Neutrophils Relative %: 67 %

## 2018-11-07 LAB — PROTIME-INR
INR: 1 (ref 0.8–1.2)
Prothrombin Time: 13 seconds (ref 11.4–15.2)

## 2018-11-07 LAB — SARS CORONAVIRUS 2 BY RT PCR (HOSPITAL ORDER, PERFORMED IN ~~LOC~~ HOSPITAL LAB): SARS Coronavirus 2: NEGATIVE

## 2018-11-07 LAB — APTT: aPTT: 25 seconds (ref 24–36)

## 2018-11-07 LAB — TSH: TSH: 2.36 u[IU]/mL (ref 0.350–4.500)

## 2018-11-07 MED ORDER — AMLODIPINE BESYLATE 10 MG PO TABS
10.0000 mg | ORAL_TABLET | Freq: Every day | ORAL | Status: DC
  Administered 2018-11-07: 10 mg via ORAL
  Filled 2018-11-07: qty 1

## 2018-11-07 MED ORDER — ADULT MULTIVITAMIN W/MINERALS CH
1.0000 | ORAL_TABLET | Freq: Every day | ORAL | Status: DC
  Administered 2018-11-08: 1 via ORAL
  Filled 2018-11-07: qty 1

## 2018-11-07 MED ORDER — INSULIN ASPART 100 UNIT/ML ~~LOC~~ SOLN
0.0000 [IU] | Freq: Every day | SUBCUTANEOUS | Status: DC
  Administered 2018-11-07: 2 [IU] via SUBCUTANEOUS
  Filled 2018-11-07: qty 1

## 2018-11-07 MED ORDER — CITALOPRAM HYDROBROMIDE 20 MG PO TABS
20.0000 mg | ORAL_TABLET | Freq: Every day | ORAL | Status: DC
  Administered 2018-11-08: 09:00:00 20 mg via ORAL
  Filled 2018-11-07: qty 1

## 2018-11-07 MED ORDER — INSULIN ASPART 100 UNIT/ML ~~LOC~~ SOLN
0.0000 [IU] | Freq: Three times a day (TID) | SUBCUTANEOUS | Status: DC
  Administered 2018-11-08: 09:00:00 3 [IU] via SUBCUTANEOUS
  Administered 2018-11-08: 5 [IU] via SUBCUTANEOUS
  Administered 2018-11-08: 18:00:00 3 [IU] via SUBCUTANEOUS
  Filled 2018-11-07 (×3): qty 1

## 2018-11-07 MED ORDER — INSULIN GLARGINE 100 UNIT/ML ~~LOC~~ SOLN
50.0000 [IU] | Freq: Every day | SUBCUTANEOUS | Status: DC
  Administered 2018-11-08: 09:00:00 50 [IU] via SUBCUTANEOUS
  Filled 2018-11-07 (×2): qty 0.5

## 2018-11-07 MED ORDER — SODIUM CHLORIDE 0.9 % IV SOLN
Freq: Once | INTRAVENOUS | Status: AC
  Administered 2018-11-07: 15:00:00 via INTRAVENOUS

## 2018-11-07 MED ORDER — ATORVASTATIN CALCIUM 20 MG PO TABS
20.0000 mg | ORAL_TABLET | Freq: Every day | ORAL | Status: DC
  Administered 2018-11-08: 09:00:00 20 mg via ORAL
  Filled 2018-11-07: qty 1

## 2018-11-07 MED ORDER — FAMOTIDINE 20 MG PO TABS
20.0000 mg | ORAL_TABLET | Freq: Every day | ORAL | Status: DC
  Administered 2018-11-07: 23:00:00 20 mg via ORAL
  Filled 2018-11-07: qty 1

## 2018-11-07 MED ORDER — SODIUM CHLORIDE 0.9 % IV SOLN
INTRAVENOUS | Status: DC
  Administered 2018-11-07: 23:00:00 via INTRAVENOUS

## 2018-11-07 MED ORDER — PANTOPRAZOLE SODIUM 40 MG PO TBEC
40.0000 mg | DELAYED_RELEASE_TABLET | Freq: Every day | ORAL | Status: DC
  Administered 2018-11-08: 40 mg via ORAL
  Filled 2018-11-07: qty 1

## 2018-11-07 MED ORDER — METOPROLOL SUCCINATE ER 50 MG PO TB24
100.0000 mg | ORAL_TABLET | Freq: Every day | ORAL | Status: DC
  Administered 2018-11-08: 100 mg via ORAL
  Filled 2018-11-07: qty 2

## 2018-11-07 MED ORDER — SODIUM CHLORIDE 0.9 % IV BOLUS
250.0000 mL | Freq: Once | INTRAVENOUS | Status: AC
  Administered 2018-11-07: 250 mL via INTRAVENOUS

## 2018-11-07 MED ORDER — SODIUM CHLORIDE 0.9% FLUSH: 3.0000 mL | Freq: Once | INTRAVENOUS | Status: DC

## 2018-11-07 MED ORDER — LISINOPRIL 5 MG PO TABS
5.0000 mg | ORAL_TABLET | Freq: Every day | ORAL | Status: DC
  Administered 2018-11-08: 5 mg via ORAL
  Filled 2018-11-07: qty 1

## 2018-11-07 MED ORDER — PROCHLORPERAZINE MALEATE 10 MG PO TABS
10.0000 mg | ORAL_TABLET | Freq: Four times a day (QID) | ORAL | Status: DC | PRN
  Filled 2018-11-07: qty 1

## 2018-11-07 MED ORDER — FAMOTIDINE 20 MG PO TABS: 20.0000 mg | ORAL_TABLET | Freq: Every day | ORAL | Status: DC

## 2018-11-07 MED ORDER — MIRTAZAPINE 15 MG PO TABS
30.0000 mg | ORAL_TABLET | Freq: Every day | ORAL | Status: DC
  Administered 2018-11-07: 23:00:00 30 mg via ORAL
  Filled 2018-11-07: qty 2

## 2018-11-07 MED ORDER — ACETAMINOPHEN 500 MG PO TABS
1000.0000 mg | ORAL_TABLET | Freq: Once | ORAL | Status: AC
  Administered 2018-11-07: 1000 mg via ORAL

## 2018-11-07 MED ORDER — AMLODIPINE BESYLATE 5 MG PO TABS: 10.0000 mg | ORAL_TABLET | Freq: Every day | ORAL | Status: DC

## 2018-11-07 MED ORDER — HEPARIN SODIUM (PORCINE) 5000 UNIT/ML IJ SOLN
5000.0000 [IU] | Freq: Three times a day (TID) | INTRAMUSCULAR | Status: DC
  Administered 2018-11-07 – 2018-11-08 (×2): 5000 [IU] via SUBCUTANEOUS
  Filled 2018-11-07 (×2): qty 1

## 2018-11-07 MED ORDER — ACETAMINOPHEN 500 MG PO TABS
1000.0000 mg | ORAL_TABLET | Freq: Once | ORAL | Status: DC
  Filled 2018-11-07: qty 2

## 2018-11-07 MED ORDER — DOCUSATE SODIUM 100 MG PO CAPS: 100.0000 mg | ORAL_CAPSULE | Freq: Two times a day (BID) | ORAL | Status: DC | PRN

## 2018-11-07 MED ORDER — ONDANSETRON HCL 4 MG PO TABS: 8.0000 mg | ORAL_TABLET | Freq: Three times a day (TID) | ORAL | Status: DC | PRN

## 2018-11-07 MED ORDER — ACETAMINOPHEN 500 MG PO TABS
500.0000 mg | ORAL_TABLET | Freq: Three times a day (TID) | ORAL | Status: DC | PRN
  Administered 2018-11-07: 500 mg via ORAL
  Filled 2018-11-07: qty 1

## 2018-11-07 NOTE — ED Notes (Signed)
ED TO INPATIENT HANDOFF REPORT  ED Nurse Name and Phone #: Daivd Council Name/Age/Gender Robin Moody 63 y.o. female Room/Bed: ED04A/ED04A  Code Status   Code Status: Not on file  Home/SNF/Other Home Patient oriented to: self, place, time and situation Is this baseline? Yes   Triage Complete: Triage complete  Chief Complaint facial droop  Triage Note Pt presents to ED via wheelchair from [facility] with c/o facial droop to L eye. Pt states eye drooping began Friday 8/7 and has progressively gotten worse. Pt states is currently being treated for lung cancer. Pt states last chemo 2 weeks ago today. Pt c/o pain to L side of her chest, where she has largest tumor.    Allergies No Known Allergies  Level of Care/Admitting Diagnosis ED Disposition    ED Disposition Condition [facility] Area: [place] [[id number]]  Level of Care: Med-Surg [16]  Covid Evaluation: Confirmed COVID Negative  Diagnosis: Hyponatremia [412878]  Admitting Physician: Vaughan Basta [[id number]]  Attending Physician: Vaughan Basta [id number]  Estimated length of stay: past midnight tomorrow  Certification:: I certify this patient will need inpatient services for at least 2 midnights  PT Class (Do Not Modify): Inpatient [101]  PT Acc Code (Do Not Modify): Private [1]       B Medical/Surgery History Past Medical History:  Diagnosis Date  . Anxiety   . Cancer ([place])   . Chronic diarrhea Noted at [facility]/29/[facility]   Pt reports having it for 5 years.  . Diabetes mellitus without complication ([place])   . GERD (gastroesophageal reflux disease)   . Hepatitis C   . Hypertension    Past Surgical History:  Procedure Laterality Date  . FINGER SURGERY  Sept 2016   Surgical repair of 4th digit on left hand     A IV Location/Drains/Wounds Patient Lines/Drains/Airways Status   Active Line/Drains/Airways    Name:   Placement date:   Placement  time:   Site:   Days:   Implanted Port 11/07/18 Right Chest   11/07/18    -    Chest   less than 1          Intake/Output Last 24 hours No intake or output data in the 24 hours ending 11/07/18 1743  Labs/Imaging Results for orders placed or performed during the hospital encounter of 11/07/18 (from the past 48 hour(s))  Glucose, capillary     Status: Abnormal   Collection Time: 11/07/18  1:20 PM  Result Value Ref Range   Glucose-Capillary 190 (H) 70 - 99 mg/dL  Protime-INR     Status: None   Collection Time: 11/07/18  1:21 PM  Result Value Ref Range   Prothrombin Time 13.0 11.4 - 15.2 seconds   INR 1.0 0.8 - 1.2    Comment: (NOTE) INR goal varies based on device and disease states. Performed at [facility], [address] [place], [place] [address]   APTT     Status: None   Collection Time: 11/07/18  1:21 PM  Result Value Ref Range   aPTT 25 24 - 36 seconds    Comment: Performed at [facility], [address] [place], [place] [address]  CBC     Status: Abnormal   Collection Time: 11/07/18  1:21 PM  Result Value Ref Range   WBC 5.6 4.0 - 10.5 K/uL   RBC 3.14 (L) 3.87 - 5.11 MIL/uL   Hemoglobin 10.4 (L) 12.0 -  15.0 g/dL   HCT 30.2 (L) 36.0 - 46.0 %   MCV 96.2 80.0 - 100.0 fL   MCH 33.1 26.0 - 34.0 pg   MCHC 34.4 30.0 - 36.0 g/dL   RDW 12.3 11.5 - 15.5 %   Platelets 124 (L) 150 - 400 K/uL   nRBC 0.0 0.0 - 0.2 %    Comment: Performed at [facility], [address] [place], [place] [address]  Differential     Status: None   Collection Time: 11/07/18  1:21 PM  Result Value Ref Range   Neutrophils Relative % 67 %   Neutro Abs 3.7 1.7 - 7.7 K/uL   Lymphocytes Relative 21 %   Lymphs Abs 1.2 0.7 - 4.0 K/uL   Monocytes Relative 10 %   Monocytes Absolute 0.6 0.1 - 1.0 K/uL   Eosinophils Relative 1 %   Eosinophils Absolute 0.1 0.0 - 0.5 K/uL   Basophils Relative 0 %   Basophils Absolute 0.0 0.0 - 0.1 K/uL   Immature Granulocytes 1 %    Abs Immature Granulocytes 0.04 0.00 - 0.07 K/uL    Comment: Performed at [facility], [address] [place], [place] [address]  Comprehensive metabolic panel     Status: Abnormal   Collection Time: 11/07/18  1:21 PM  Result Value Ref Range   Sodium 126 (L) 135 - 145 mmol/L   Potassium 4.3 3.5 - 5.1 mmol/L   Chloride 89 (L) 98 - 111 mmol/L   CO2 28 22 - 32 mmol/L   Glucose, Bld 190 (H) 70 - 99 mg/dL   BUN 24 (H) 8 - 23 mg/dL   Creatinine, Ser 1.04 (H) 0.44 - 1.00 mg/dL   Calcium 9.4 8.9 - 10.3 mg/dL   Total Protein 7.3 6.5 - 8.1 g/dL   Albumin 4.0 3.5 - 5.0 g/dL   AST 18 15 - 41 U/L   ALT 15 0 - 44 U/L   Alkaline Phosphatase 65 38 - 126 U/L   Total Bilirubin 0.4 0.3 - 1.2 mg/dL   GFR calc non Af Amer 57 (L) >60 mL/min   GFR calc Af Amer >60 >60 mL/min   Anion gap 9 5 - 15    Comment: Performed at [facility], [address] [place], [place] [address]  Urinalysis, Complete w Microscopic     Status: Abnormal   Collection Time: 11/07/18  1:50 PM  Result Value Ref Range   Color, Urine YELLOW (A) YELLOW   APPearance CLEAR (A) CLEAR   Specific Gravity, Urine 1.009 1.005 - 1.030   pH 7.0 5.0 - 8.0   Glucose, UA 50 (A) NEGATIVE mg/dL   Hgb urine dipstick NEGATIVE NEGATIVE   Bilirubin Urine NEGATIVE NEGATIVE   Ketones, ur NEGATIVE NEGATIVE mg/dL   Protein, ur NEGATIVE NEGATIVE mg/dL   Nitrite NEGATIVE NEGATIVE   Leukocytes,Ua TRACE (A) NEGATIVE   RBC / HPF 0-5 0 - 5 RBC/hpf   WBC, UA 6-10 0 - 5 WBC/hpf   Bacteria, UA NONE SEEN NONE SEEN   Squamous Epithelial / LPF NONE SEEN 0 - 5    Comment: Performed at [facility], [address], [place], [place] [address]  SARS Coronavirus 2 [facility] order, Performed in [facility] hospital lab) Nasopharyngeal Nasopharyngeal Swab     Status: None   Collection Time: 11/07/18  1:53 PM   Specimen: Nasopharyngeal Swab  Result Value Ref Range   SARS Coronavirus 2 NEGATIVE NEGATIVE    Comment: (NOTE) If  result  is NEGATIVE SARS-CoV-2 target nucleic acids are NOT DETECTED. The SARS-CoV-2 RNA is generally detectable in upper and lower  respiratory specimens during the acute phase of infection. The lowest  concentration of SARS-CoV-2 viral copies this assay can detect is 250  copies / mL. A negative result does not preclude SARS-CoV-2 infection  and should not be used as the sole basis for treatment or other  patient management decisions.  A negative result may occur with  improper specimen collection / handling, submission of specimen other  than nasopharyngeal swab, presence of viral mutation(s) within the  areas targeted by this assay, and inadequate number of viral copies  (<250 copies / mL). A negative result must be combined with clinical  observations, patient history, and epidemiological information. If result is POSITIVE SARS-CoV-2 target nucleic acids are DETECTED. The SARS-CoV-2 RNA is generally detectable in upper and lower  respiratory specimens dur ing the acute phase of infection.  Positive  results are indicative of active infection with SARS-CoV-2.  Clinical  correlation with patient history and other diagnostic information is  necessary to determine patient infection status.  Positive results do  not rule out bacterial infection or co-infection with other viruses. If result is PRESUMPTIVE POSTIVE SARS-CoV-2 nucleic acids MAY BE PRESENT.   A presumptive positive result was obtained on the submitted specimen  and confirmed on repeat testing.  While 2019 novel coronavirus  (SARS-CoV-2) nucleic acids may be present in the submitted sample  additional confirmatory testing may be necessary for epidemiological  and / or clinical management purposes  to differentiate between  SARS-CoV-2 and other Sarbecovirus currently known to infect humans.  If clinically indicated additional testing with an alternate test  methodology [phone]) is advised. The SARS-CoV-2 RNA is generally   detectable in upper and lower respiratory sp ecimens during the acute  phase of infection. The expected result is Negative. Fact Sheet for Patients:  StrictlyIdeas.no Fact Sheet for Healthcare Providers: BankingDealers.co.za This test is not yet approved or cleared by the [place] FDA and has been authorized for detection and/or diagnosis of SARS-CoV-2 by FDA under an Emergency Use Authorization (EUA).  This EUA will remain in effect (meaning this test can be used) for the duration of the COVID-19 declaration under Section 564(b)(1) of the Act, 21 U.S.C. section 360bbb-3(b)(1), unless the authorization is terminated or revoked sooner. Performed at [facility], [address], [place], [place] [address]    Dg Chest 1 View  Result Date: 11/07/2018 CLINICAL DATA:  Left-sided chest pain. History of lung cancer. Possible foreign body seen on frontal chest x-ray from same day. EXAM: CHEST  1 VIEW COMPARISON:  Chest x-ray from same day. FINDINGS: Small round radiopaque density projecting over the left lower lobe bronchus on the previous frontal chest x-ray is external to the patient along the anterior chest wall on the lateral view. IMPRESSION: Small round radiopaque density seen on the prior frontal chest x-ray is external to the patient. Electronically Signed   By: Titus Dubin M.D.   On: 11/07/2018 15:22   Ct Head Wo Contrast  Result Date: 11/07/2018 CLINICAL DATA:  Left-sided facial droop.  History of lung carcinoma EXAM: CT HEAD WITHOUT CONTRAST TECHNIQUE: Contiguous axial images were obtained from the base of the skull through the vertex without intravenous contrast. COMPARISON:  None. FINDINGS: Brain: There is age related volume loss. There is no evident intracranial mass, hemorrhage, extra-axial fluid collection, or midline shift. There is patchy small vessel disease throughout  the centra semiovale bilaterally. There is no  appreciable acute infarct. Vascular: There is no hyperdense vessel. There is calcification in each carotid siphon region. Skull: Bony calvarium appears intact. Sinuses/Orbits: Visualized paranasal sinuses are clear. Visualized orbits appear symmetric bilaterally. Other: Mastoid air cells are clear. There is debris in the left external auditory canal. IMPRESSION: Patchy periventricular small vessel disease. No acute infarct. No mass or hemorrhage. There are foci of arterial vascular calcification. There is probable cerumen in the left external auditory canal. Electronically Signed   By: Lowella Grip III M.D.   On: 11/07/2018 13:49   Dg Chest Portable 1 View  Result Date: 11/07/2018 CLINICAL DATA:  Left-sided chest pain EXAM: PORTABLE CHEST 1 VIEW COMPARISON:  Radiograph 05/18/2008 FINDINGS: Coarse interstitial changes are present throughout the lungs. Nodular opacity present in the left upper lung. Partial collapse of the left upper lobe possibly related to underlying mass lesion given history of lung malignancy. No other consolidative opacities. Pulmonary vascularity is normal. Indeterminate rounded density projects over the left chest, in the region of the left lower lobe bronchus. Unclear if this could reflect an endobronchial foreign body. Accessed right IJ approach port catheter tip terminates near the superior cavoatrial junction. The bones are diffusely demineralized. No other acute osseous or soft tissue abnormality. IMPRESSION: 1. Partial collapse of the left upper lobe possibly related to underlying mass lesion given history of lung malignancy. 2. Indeterminate rounded density projecting over the left chest, in the region of the left lower lobe bronchus. Unclear if this could reflect an endobronchial foreign body. Correlate with patient history and symptomatology. Consider lateral radiograph for further investigation. These results were called by telephone at the time of interpretation on 11/07/2018  at 2:29 pm to Dr. Merlyn Lot , who verbally acknowledged these results. Electronically Signed   By: Lovena Le M.D.   On: 11/07/2018 14:29    Pending Labs Unresulted Labs (From admission, onward)    Start     Ordered   11/07/18 1703  TSH  Add-on,   AD     11/07/18 1702   Signed and Held  HIV antibody (Routine Testing)  Once,   R     Signed and Held   Signed and Occupational hygienist morning,   R     Signed and Held   Signed and Held  CBC  Tomorrow morning,   R     Signed and Held   Signed and Held  CBC  (heparin)  Once,   R    Comments: Baseline for heparin therapy IF NOT ALREADY DRAWN.  Notify MD if PLT < 100 K.    Signed and Held   Signed and Held  Creatinine, serum  (heparin)  Once,   R    Comments: Baseline for heparin therapy IF NOT ALREADY DRAWN.    Signed and Held          Vitals/Pain Today's Vitals   11/07/18 1620 11/07/18 1630 11/07/18 1700 11/07/18 1730  BP:  (!) 143/67 116/66 118/70  Pulse:  63 62 63  Resp:  16 16 15   Temp:      TempSrc:      SpO2:  99% 98% 99%  Weight:      Height:      PainSc: 6        Isolation Precautions No active isolations  Medications Medications  sodium chloride flush (NS) 0.9 % injection 3 mL (3 mLs Intravenous Not Given 11/07/18  1323)  0.9 %  sodium chloride infusion ( Intravenous Rate/Dose Change 11/07/18 1738)  sodium chloride 0.9 % bolus 250 mL (0 mLs Intravenous Stopped 11/07/18 1453)  0.9 %  sodium chloride infusion ( Intravenous Stopped 11/07/18 1739)  acetaminophen (TYLENOL) tablet 1,000 mg (1,000 mg Oral Given 11/07/18 1550)    Mobility walks Low fall risk   Focused Assessments Neuro Assessment Handoff:  Swallow screen pass? Yes    NIH Stroke Scale ( + Modified Stroke Scale Criteria)  Interval: Initial Level of Consciousness (1a.)   : Alert, keenly responsive LOC Questions (1b. )   +: Answers both questions correctly LOC Commands (1c. )   + : Performs both tasks correctly Best Gaze (2.  )  +: Normal Visual (3. )  +: No visual loss Facial Palsy (4. )    : Normal symmetrical movements Motor Arm, Left (5a. )   +: No drift Motor Arm, Right (5b. )   +: No drift Motor Leg, Left (6a. )   +: No drift Motor Leg, Right (6b. )   +: No drift Limb Ataxia (7. ): Absent Sensory (8. )   +: Normal, no sensory loss Best Language (9. )   +: No aphasia Dysarthria (10. ): Normal Extinction/Inattention (11.)   +: No Abnormality Modified SS Total  +: 0 Complete NIHSS TOTAL: 0 Last date known well: 11/03/18   Neuro Assessment: (denies weakness, fever, NVD, swelling noticed to the left eye) Neuro Checks:   Initial (11/07/18 1333)  Last Documented NIHSS Modified Score: 0 (11/07/18 1333) Has TPA been given? No If patient is a Neuro Trauma and patient is going to OR before floor call report to [place] nurse: [phone] or [phone]     R Recommendations: See Admitting Provider Note  Report given to:   Additional Notes:

## 2018-11-07 NOTE — ED Notes (Signed)
Spoke with Dr. Quentin Cornwall regarding patient care prior to patient being roomed, per Dr. Quentin Cornwall, no troponin.

## 2018-11-07 NOTE — ED Notes (Signed)
Pt given diet cola and graham crackers and applesauce.

## 2018-11-07 NOTE — ED Provider Notes (Addendum)
[facility] Emergency Department Provider Note    First MD Initiated Contact with Patient 11/07/18 1330     (approximate)  I have reviewed the triage vital signs and the nursing notes.   HISTORY  Chief Complaint Facial Droop    HPI Robin Moody is a 63 y.o. female below listed past medical history undergoing chemotherapy for lung cancer presents to the ER for evaluation of generalized weakness as well as left eye droop that started on Saturday.  Denies any other associated numbness or tingling.  Denies any new pain but has been having chronic pain particular on the left side.  Denies any cough at this time.  No measured fevers.    Past Medical History:  Diagnosis Date  . Anxiety   . Cancer ([place])   . Chronic diarrhea Noted at 04/29/15 [facility] Visit   Pt reports having it for 5 years.  . Diabetes mellitus without complication ([place])   . GERD (gastroesophageal reflux disease)   . Hepatitis C   . Hypertension    No family history on file. Past Surgical History:  Procedure Laterality Date  . FINGER SURGERY  Sept 2016   Surgical repair of 4th digit on left hand   Patient Active Problem List   Diagnosis Date Noted  . Diabetes ([place]) 05/12/2015  . Essential hypertension 05/12/2015  . GERD (gastroesophageal reflux disease) 05/12/2015  . Hepatitis C 05/12/2015      Prior to Admission medications   Medication Sig Start Date End Date Taking? Authorizing Provider  acetaminophen (TYLENOL) 500 MG tablet Take 500 mg by mouth every 8 (eight) hours as needed. 03/09/18  Yes [provider]  amLODipine (NORVASC) 5 MG tablet TAKE ONE TABLET BY MOUTH EVERY DAY Patient taking differently: Take 10 mg by mouth daily. Taking one PO daily 06/01/16  Yes McGowan, Shannon A, PA-C  atorvastatin (LIPITOR) 20 MG tablet Take 20 mg by mouth daily.    Yes [provider]  citalopram (CELEXA) 20 MG tablet Take 20 mg by mouth daily.   Yes [provider]  famotidine (PEPCID) 20 MG tablet Take 20 mg by mouth daily.   Yes [provider]  Insulin Glargine (LANTUS [place]) Inject 50 Units into the skin daily.    Yes [provider]  lisinopril (PRINIVIL,ZESTRIL) 20 MG tablet Take 1 tablet (20 mg total) by mouth daily. Patient taking differently: Take 5 mg by mouth daily.  10/23/15  Yes Odem, Coolidge Breeze, FNP  metoprolol succinate (TOPROL-XL) 100 MG 24 hr tablet Take 100 mg by mouth daily. 10/20/18 10/21/19 Yes [provider]  mirtazapine (REMERON) 30 MG tablet Take 30 mg by mouth at bedtime.   Yes [provider]  Multiple Vitamin (MULTIVITAMIN) tablet Take 1 tablet by mouth daily.   Yes [provider]  ondansetron (ZOFRAN) 8 MG tablet Take 8 mg by mouth every 8 (eight) hours as needed for nausea or vomiting. TAKE 2 TABLETS AS NEEDED FOR NAUSEA AND VOMITING    Yes [provider]  pantoprazole (PROTONIX) 40 MG tablet Take 40 mg by mouth daily.   Yes [provider]  prochlorperazine (COMPAZINE) 10 MG tablet Take 10 mg by mouth every 6 (six) hours as needed for nausea or vomiting.   Yes [provider]    Allergies Patient has no known allergies.    Social History Social History   Tobacco Use  . Smoking status: Light Tobacco Smoker    Types:  Cigarettes  . Smokeless tobacco: Never Used  . Tobacco comment: 1 cigarette/day   Substance Use Topics  . Alcohol use: Not Currently    Frequency: Never    Comment: Clean for 10 years  . Drug use: No    Review of Systems Patient denies headaches, rhinorrhea, blurry vision, numbness, shortness of breath, chest pain, edema, cough, abdominal pain, nausea, vomiting, diarrhea, dysuria, fevers, rashes or hallucinations unless otherwise stated above in HPI. ____________________________________________   PHYSICAL EXAM:  VITAL SIGNS: Vitals:   11/07/18 1313 11/07/18 1500  BP: 126/75 140/71  Pulse: 67 61  Resp: 18 15   Temp: 98.7 F (37.1 C)   SpO2: 100% 100%    Constitutional: Alert and oriented.  Eyes: Conjunctivae are normal.  Left eye lid weakness and droop.  Perrla, eomi Head: Atraumatic. Nose: No congestion/rhinnorhea. Mouth/Throat: Mucous membranes are moist.   Neck: No stridor. Painless ROM.  Cardiovascular: Normal rate, regular rhythm. Grossly normal heart sounds.  Good peripheral circulation. Respiratory: Normal respiratory effort.  No retractions. Lungs CTAB. Gastrointestinal: Soft and nontender. No distention. No abdominal bruits. No CVA tenderness. Genitourinary: deferred Musculoskeletal: No lower extremity tenderness nor edema.  No joint effusions. Neurologic:  Normal speech and language. Left eye droop and trace left lip droop, sparing forehead. No gross focal neurologic deficits are appreciated. No facial droop Skin:  Skin is warm, dry and intact. No rash noted. Psychiatric: Mood and affect are normal. Speech and behavior are normal.  ____________________________________________   LABS (all labs ordered are listed, but only abnormal results are displayed)  Results for orders placed or performed during the hospital encounter of 11/07/18 (from the past 24 hour(s))  Glucose, capillary     Status: Abnormal   Collection Time: 11/07/18  1:20 PM  Result Value Ref Range   Glucose-Capillary 190 (H) 70 - 99 mg/dL  Protime-INR     Status: None   Collection Time: 11/07/18  1:21 PM  Result Value Ref Range   Prothrombin Time 13.0 11.4 - 15.2 seconds   INR 1.0 0.8 - 1.2  APTT     Status: None   Collection Time: 11/07/18  1:21 PM  Result Value Ref Range   aPTT 25 24 - 36 seconds  CBC     Status: Abnormal   Collection Time: 11/07/18  1:21 PM  Result Value Ref Range   WBC 5.6 4.0 - 10.5 K/uL   RBC 3.14 (L) 3.87 - 5.11 MIL/uL   Hemoglobin 10.4 (L) 12.0 - 15.0 g/dL   HCT 30.2 (L) 36.0 - 46.0 %   MCV 96.2 80.0 - 100.0 fL   MCH 33.1 26.0 - 34.0 pg   MCHC 34.4 30.0 - 36.0 g/dL   RDW 12.3  11.5 - 15.5 %   Platelets 124 (L) 150 - 400 K/uL   nRBC 0.0 0.0 - 0.2 %  Differential     Status: None   Collection Time: 11/07/18  1:21 PM  Result Value Ref Range   Neutrophils Relative % 67 %   Neutro Abs 3.7 1.7 - 7.7 K/uL   Lymphocytes Relative 21 %   Lymphs Abs 1.2 0.7 - 4.0 K/uL   Monocytes Relative 10 %   Monocytes Absolute 0.6 0.1 - 1.0 K/uL   Eosinophils Relative 1 %   Eosinophils Absolute 0.1 0.0 - 0.5 K/uL   Basophils Relative 0 %   Basophils Absolute 0.0 0.0 - 0.1 K/uL   Immature Granulocytes 1 %   Abs Immature Granulocytes 0.04 0.00 -  0.07 K/uL  Comprehensive metabolic panel     Status: Abnormal   Collection Time: 11/07/18  1:21 PM  Result Value Ref Range   Sodium 126 (L) 135 - 145 mmol/L   Potassium 4.3 3.5 - 5.1 mmol/L   Chloride 89 (L) 98 - 111 mmol/L   CO2 28 22 - 32 mmol/L   Glucose, Bld 190 (H) 70 - 99 mg/dL   BUN 24 (H) 8 - 23 mg/dL   Creatinine, Ser 1.04 (H) 0.44 - 1.00 mg/dL   Calcium 9.4 8.9 - 10.3 mg/dL   Total Protein 7.3 6.5 - 8.1 g/dL   Albumin 4.0 3.5 - 5.0 g/dL   AST 18 15 - 41 U/L   ALT 15 0 - 44 U/L   Alkaline Phosphatase 65 38 - 126 U/L   Total Bilirubin 0.4 0.3 - 1.2 mg/dL   GFR calc non Af Amer 57 (L) >60 mL/min   GFR calc Af Amer >60 >60 mL/min   Anion gap 9 5 - 15  Urinalysis, Complete w Microscopic     Status: Abnormal   Collection Time: 11/07/18  1:50 PM  Result Value Ref Range   Color, Urine YELLOW (A) YELLOW   APPearance CLEAR (A) CLEAR   Specific Gravity, Urine 1.009 1.005 - 1.030   pH 7.0 5.0 - 8.0   Glucose, UA 50 (A) NEGATIVE mg/dL   Hgb urine dipstick NEGATIVE NEGATIVE   Bilirubin Urine NEGATIVE NEGATIVE   Ketones, ur NEGATIVE NEGATIVE mg/dL   Protein, ur NEGATIVE NEGATIVE mg/dL   Nitrite NEGATIVE NEGATIVE   Leukocytes,Ua TRACE (A) NEGATIVE   RBC / HPF 0-5 0 - 5 RBC/hpf   WBC, UA 6-10 0 - 5 WBC/hpf   Bacteria, UA NONE SEEN NONE SEEN   Squamous Epithelial / LPF NONE SEEN 0 - 5  SARS Coronavirus 2 [facility] order,  Performed in [place] [facility] hospital lab) Nasopharyngeal Nasopharyngeal Swab     Status: None   Collection Time: 11/07/18  1:53 PM   Specimen: Nasopharyngeal Swab  Result Value Ref Range   SARS Coronavirus 2 NEGATIVE NEGATIVE   ____________________________________________  EKG My review and personal interpretation at Time: 13:18   Indication: weakness, facial droop  Rate: 65  Rhythm: sinus Axis: normal Other: normal intervals, no stemi ____________________________________________  RADIOLOGY  I personally reviewed all radiographic images ordered to evaluate for the above acute complaints and reviewed radiology reports and findings.  These findings were personally discussed with the patient.  Please see medical record for radiology report.  ____________________________________________   PROCEDURES  Procedure(s) performed:  .Critical Care Performed by: Merlyn Lot, MD Authorized by: Merlyn Lot, MD   Critical care provider statement:    Critical care time (minutes):  30   Critical care time was exclusive of:  Separately billable procedures and treating other patients   Critical care was necessary to treat or prevent imminent or life-threatening deterioration of the following conditions:  Metabolic crisis   Critical care was time spent personally by me on the following activities:  Development of treatment plan with patient or surrogate, discussions with consultants, evaluation of patient's response to treatment, examination of patient, obtaining history from patient or surrogate, ordering and performing treatments and interventions, ordering and review of laboratory studies, ordering and review of radiographic studies, pulse oximetry, re-evaluation of patient's condition and review of old charts      Critical Care performed: yes ____________________________________________   INITIAL IMPRESSION / [place] / ED COURSE  Pertinent labs &  imaging results that  were available during my care of the patient were reviewed by me and considered in my medical decision making (see chart for details).   DDX: CVA, Bell's, Horner's, CNS mass, CVA, electrolyte abnormality  SKILER TYE is a 63 y.o. who presents to the ED with symptoms as described above.  Patient with left facial droop and left eyelid droop no other focal neuro deficits at this time.  Symptoms started on Saturday.  She is well outside the window for code stroke.  Also evaluate for electrolyte abnormality.  Possible metastatic disease given her known lung cancer.  Clinical Course as of Nov 06 1505  Tue Nov 07, 2018  1358 Patient noted to have acute hyponatremia.  Will give IV fluids   [PR]  1456 Radiopaque abnormality on initial portable 1 view consistent with button on her blouse.   [PR]    Clinical Course User Index [PR] Merlyn Lot, MD    The patient was evaluated in Emergency Department today for the symptoms described in the history of present illness. He/she was evaluated in the context of the global COVID-19 pandemic, which necessitated consideration that the patient might be at risk for infection with the SARS-CoV-2 virus that causes COVID-19. Institutional protocols and algorithms that pertain to the evaluation of patients at risk for COVID-19 are in a state of rapid change based on information released by regulatory bodies including the CDC and federal and state organizations. These policies and algorithms were followed during the patient's care in the ED.  As part of my medical decision making, I reviewed the following data within the [place] notes reviewed and incorporated, Labs reviewed, notes from prior ED visits and [place] Controlled Substance Database   ____________________________________________   FINAL CLINICAL IMPRESSION(S) / ED DIAGNOSES  Final diagnoses:  Facial droop  Acute hyponatremia      NEW MEDICATIONS STARTED DURING THIS  VISIT:  New Prescriptions   No medications on file     Note:  This document was prepared using Dragon voice recognition software and may include unintentional dictation errors.    Merlyn Lot, MD 11/07/18 1438    Merlyn Lot, MD 11/07/18 [phone]

## 2018-11-07 NOTE — H&P (Signed)
[name] [place] NAME: Robin Moody    MR#:  [id number]  DATE OF BIRTH:  12/31/1955  DATE OF ADMISSION:  11/07/2018  PRIMARY CARE PHYSICIAN: Gale Journey, MD   REQUESTING/REFERRING PHYSICIAN: RObinson  CHIEF COMPLAINT:   Chief Complaint  Patient presents with  . Facial Droop    HISTORY OF PRESENT ILLNESS: Robin Moody  is a 63 y.o. female with a known history of anxiety, lung cancer, gastroesophageal reflux disease, diabetes, hepatitis C, hypertension-received last chemotherapy 2 weeks ago.  Since last week Friday she has drooping of her left eye lid.  Which has got worse in last 2 days.  She also have generalized weakness in last 2 days.  She denies any associated headache, double vision, speech problem, facial numbness or weakness, focal weakness or numbness on any of her limbs, dizziness, nausea or vomiting. Concerned with worsening complaint she decided to come to emergency room today.  She was noted no new findings on CT scan of the head.  Her sodium level was 126 which was running normal in the past.  With this ER physician suggested to admit to hospitalist service.  When I went to see her earlier she refused to get admitted so I spoke to ER physician who took over from the first team.  Later after explanation she agreed to get admitted and I have admitted the patient.  PAST MEDICAL HISTORY:   Past Medical History:  Diagnosis Date  . Anxiety   . Cancer ([place])   . Chronic diarrhea Noted at 04/29/15 [facility] Visit   Pt reports having it for 5 years.  . Diabetes mellitus without complication ([place])   . GERD (gastroesophageal reflux disease)   . Hepatitis C   . Hypertension     PAST SURGICAL HISTORY:  Past Surgical History:  Procedure Laterality Date  . FINGER SURGERY  Sept 2016   Surgical repair of 4th digit on left hand    SOCIAL HISTORY:  Social History   Tobacco Use  . Smoking status: Light Tobacco Smoker     Types: Cigarettes  . Smokeless tobacco: Never Used  . Tobacco comment: 1 cigarette/day   Substance Use Topics  . Alcohol use: Not Currently    Frequency: Never    Comment: Clean for 10 years    FAMILY HISTORY:  Family History  Problem Relation Age of Onset  . Hypertension Mother     DRUG ALLERGIES: No Known Allergies  REVIEW OF SYSTEMS:   CONSTITUTIONAL: No fever, have fatigue or weakness.  EYES: No blurred or double vision.  EARS, NOSE, AND THROAT: No tinnitus or ear pain.  Left eyelid dropping. RESPIRATORY: No cough, shortness of breath, wheezing or hemoptysis.  CARDIOVASCULAR: No chest pain, orthopnea, edema.  GASTROINTESTINAL: No nausea, vomiting, diarrhea or abdominal pain.  GENITOURINARY: No dysuria, hematuria.  ENDOCRINE: No polyuria, nocturia,  HEMATOLOGY: No anemia, easy bruising or bleeding SKIN: No rash or lesion. MUSCULOSKELETAL: No joint pain or arthritis.   NEUROLOGIC: No tingling, numbness, weakness.  PSYCHIATRY: No anxiety or depression.   MEDICATIONS AT HOME:  Prior to Admission medications   Medication Sig Start Date End Date Taking? Authorizing Provider  acetaminophen (TYLENOL) 500 MG tablet Take 500 mg by mouth every 8 (eight) hours as needed. 03/09/18  Yes [provider]  amLODipine (NORVASC) 5 MG tablet TAKE ONE TABLET BY MOUTH EVERY DAY Patient taking differently: Take 10 mg by mouth daily. Taking one PO daily 06/01/16  Yes McGowan, Shannon A, PA-C  atorvastatin (LIPITOR) 20 MG tablet Take 20 mg by mouth daily.    Yes [provider]  citalopram (CELEXA) 20 MG tablet Take 20 mg by mouth daily.   Yes [provider]  famotidine (PEPCID) 20 MG tablet Take 20 mg by mouth daily.   Yes [provider]  Insulin Glargine (LANTUS [place]) Inject 50 Units into the skin daily.    Yes [provider]  lisinopril (PRINIVIL,ZESTRIL) 20 MG tablet Take 1 tablet (20 mg total) by mouth daily. Patient taking differently: Take 5  mg by mouth daily.  10/23/15  Yes Odem, Coolidge Breeze, FNP  metoprolol succinate (TOPROL-XL) 100 MG 24 hr tablet Take 100 mg by mouth daily. 10/20/18 10/21/19 Yes [provider]  mirtazapine (REMERON) 30 MG tablet Take 30 mg by mouth at bedtime.   Yes [provider]  Multiple Vitamin (MULTIVITAMIN) tablet Take 1 tablet by mouth daily.   Yes [provider]  ondansetron (ZOFRAN) 8 MG tablet Take 8 mg by mouth every 8 (eight) hours as needed for nausea or vomiting. TAKE 2 TABLETS AS NEEDED FOR NAUSEA AND VOMITING    Yes [provider]  pantoprazole (PROTONIX) 40 MG tablet Take 40 mg by mouth daily.   Yes [provider]  prochlorperazine (COMPAZINE) 10 MG tablet Take 10 mg by mouth every 6 (six) hours as needed for nausea or vomiting.   Yes [provider]      PHYSICAL EXAMINATION:   VITAL SIGNS: Blood pressure 118/70, pulse 63, temperature 98.7 F (37.1 C), temperature source Oral, resp. rate 15, height 5\' 4"  (1.626 m), weight 75.8 kg, last menstrual period 01/09/2001, SpO2 99 %.  GENERAL:  63 y.o.-year-old patient lying in the bed with no acute distress.  EYES: Pupils equal, round, reactive to light and accommodation. No scleral icterus. Extraocular muscles intact. Left eye lid dropping. HEENT: Head atraumatic, normocephalic. Oropharynx and nasopharynx clear.  NECK:  Supple, no jugular venous distention. No thyroid enlargement, no tenderness.  LUNGS: Normal breath sounds bilaterally, no wheezing, rales,rhonchi or crepitation. No use of accessory muscles of respiration.  CARDIOVASCULAR: S1, S2 normal. No murmurs, rubs, or gallops.  ABDOMEN: Soft, nontender, nondistended. Bowel sounds present. No organomegaly or mass.  EXTREMITIES: No pedal edema, cyanosis, or clubbing.  NEUROLOGIC: Cranial nerves II through XII are intact. Muscle strength 5/5 in all extremities. Sensation intact. Gait not checked.  PSYCHIATRIC: The patient is alert and oriented  x 3.  SKIN: No obvious rash, lesion, or ulcer.   LABORATORY PANEL:   CBC Recent Labs  Lab 11/07/18 1321  WBC 5.6  HGB 10.4*  HCT 30.2*  PLT 124*  MCV 96.2  MCH 33.1  MCHC 34.4  RDW 12.3  LYMPHSABS 1.2  MONOABS 0.6  EOSABS 0.1  BASOSABS 0.0   ------------------------------------------------------------------------------------------------------------------  Chemistries  Recent Labs  Lab 11/07/18 1321  NA 126*  K 4.3  CL 89*  CO2 28  GLUCOSE 190*  BUN 24*  CREATININE 1.04*  CALCIUM 9.4  AST 18  ALT 15  ALKPHOS 65  BILITOT 0.4   ------------------------------------------------------------------------------------------------------------------ estimated creatinine clearance is 55.2 mL/min (A) (by C-G formula based on SCr of 1.04 mg/dL (H)). ------------------------------------------------------------------------------------------------------------------ Recent Labs    11/07/18 1321  TSH 2.360     Coagulation profile Recent Labs  Lab 11/07/18 1321  INR 1.0   ------------------------------------------------------------------------------------------------------------------- No results for input(s): DDIMER in the last 72 hours. -------------------------------------------------------------------------------------------------------------------  Cardiac Enzymes No results for input(s): CKMB,  TROPONINI, MYOGLOBIN in the last 168 hours.  Invalid input(s): CK ------------------------------------------------------------------------------------------------------------------ Invalid input(s): POCBNP  ---------------------------------------------------------------------------------------------------------------  Urinalysis    Component Value Date/Time   COLORURINE YELLOW (A) 11/07/2018 1350   APPEARANCEUR CLEAR (A) 11/07/2018 1350   LABSPEC 1.009 11/07/2018 1350   PHURINE 7.0 11/07/2018 1350   GLUCOSEU 50 (A) 11/07/2018 1350   HGBUR NEGATIVE 11/07/2018 1350    BILIRUBINUR NEGATIVE 11/07/2018 1350   KETONESUR NEGATIVE 11/07/2018 1350   PROTEINUR NEGATIVE 11/07/2018 1350   NITRITE NEGATIVE 11/07/2018 1350   LEUKOCYTESUR TRACE (A) 11/07/2018 1350     RADIOLOGY: Dg Chest 1 View  Result Date: 11/07/2018 CLINICAL DATA:  Left-sided chest pain. History of lung cancer. Possible foreign body seen on frontal chest x-ray from same day. EXAM: CHEST  1 VIEW COMPARISON:  Chest x-ray from same day. FINDINGS: Small round radiopaque density projecting over the left lower lobe bronchus on the previous frontal chest x-ray is external to the patient along the anterior chest wall on the lateral view. IMPRESSION: Small round radiopaque density seen on the prior frontal chest x-ray is external to the patient. Electronically Signed   By: Titus Dubin M.D.   On: 11/07/2018 15:22   Ct Head Wo Contrast  Result Date: 11/07/2018 CLINICAL DATA:  Left-sided facial droop.  History of lung carcinoma EXAM: CT HEAD WITHOUT CONTRAST TECHNIQUE: Contiguous axial images were obtained from the base of the skull through the vertex without intravenous contrast. COMPARISON:  None. FINDINGS: Brain: There is age related volume loss. There is no evident intracranial mass, hemorrhage, extra-axial fluid collection, or midline shift. There is patchy small vessel disease throughout the centra semiovale bilaterally. There is no appreciable acute infarct. Vascular: There is no hyperdense vessel. There is calcification in each carotid siphon region. Skull: Bony calvarium appears intact. Sinuses/Orbits: Visualized paranasal sinuses are clear. Visualized orbits appear symmetric bilaterally. Other: Mastoid air cells are clear. There is debris in the left external auditory canal. IMPRESSION: Patchy periventricular small vessel disease. No acute infarct. No mass or hemorrhage. There are foci of arterial vascular calcification. There is probable cerumen in the left external auditory canal. Electronically  Signed   By: Lowella Grip III M.D.   On: 11/07/2018 13:49   Dg Chest Portable 1 View  Result Date: 11/07/2018 CLINICAL DATA:  Left-sided chest pain EXAM: PORTABLE CHEST 1 VIEW COMPARISON:  Radiograph 05/18/2008 FINDINGS: Coarse interstitial changes are present throughout the lungs. Nodular opacity present in the left upper lung. Partial collapse of the left upper lobe possibly related to underlying mass lesion given history of lung malignancy. No other consolidative opacities. Pulmonary vascularity is normal. Indeterminate rounded density projects over the left chest, in the region of the left lower lobe bronchus. Unclear if this could reflect an endobronchial foreign body. Accessed right IJ approach port catheter tip terminates near the superior cavoatrial junction. The bones are diffusely demineralized. No other acute osseous or soft tissue abnormality. IMPRESSION: 1. Partial collapse of the left upper lobe possibly related to underlying mass lesion given history of lung malignancy. 2. Indeterminate rounded density projecting over the left chest, in the region of the left lower lobe bronchus. Unclear if this could reflect an endobronchial foreign body. Correlate with patient history and symptomatology. Consider lateral radiograph for further investigation. These results were called by telephone at the time of interpretation on 11/07/2018 at 2:29 pm to Dr. Merlyn Lot , who verbally acknowledged these results. Electronically Signed   By: Lovena Le M.D.   On: 11/07/2018  14:29    EKG: Orders placed or performed during the hospital encounter of 11/07/18  . ED EKG  . ED EKG    IMPRESSION AND PLAN:  *Hyponatremia Generalized weakness  Check TSH IV fluid normal saline to replace If sodium level does not correct then we may need to call nephrology consult and possibly consider stopping citalopram.  *Left eye ptosis This could be some nerve compression/palsy. CT head is negative. As  this is present for last 4 to 5 days, I will call neurology consult first before moving on further evaluation.  I texted Dr. Doy Mince.  *Diabetes Continue home dose of Lantus and keep on sliding scale coverage.  *Hypertension Continue amlodipine, lisinopril, metoprolol  *Hyperlipidemia Continue atorvastatin.  *Gastroesophageal reflux disease Continue pantoprazole.  *Active smoking Counseled to quit smoking for 4 minutes and offered nicotine patch.    All the records are reviewed and case discussed with ED provider. Management plans discussed with the patient, family and they are in agreement.  CODE STATUS: Advance Directive Documentation     Most Recent Value  Type of Advance Directive  Healthcare Power of Attorney  Pre-existing out of facility DNR order (yellow form or pink MOST form)  -  "MOST" Form in Place?  -       TOTAL TIME TAKING CARE OF THIS PATIENT: 45 minutes.  Patient sister was present in the room during her visit.  Vaughan Basta M.D on 11/07/2018   Between 7am to 6pm - Pager - [phone]  After 6pm go to [email or web address] - password [email or web address] [facility]  Office  [phone]  CC: Primary care physician; Gale Journey, MD   Note: This dictation was prepared with Dragon dictation along with smaller phrase technology. Any transcriptional errors that result from this process are unintentional.

## 2018-11-07 NOTE — Progress Notes (Signed)
Family Meeting Note  Advance Directive:yes  Today a meeting took place with the Patient and sister.   The following clinical team members were present during this meeting:MD  The following were discussed:Patient's diagnosis: Lung mass, ptosis, hyponatremia, hypertension, diabetes, Patient's progosis: Unable to determine and Goals for treatment: Full Code  Additional follow-up to be provided: Neurology  Time spent during discussion:20 minutes  Vaughan Basta, MD

## 2018-11-07 NOTE — ED Notes (Signed)
Pt ambulated to the bathroom in the room without assistance

## 2018-11-07 NOTE — ED Notes (Signed)
Pt in CT, will assess when arrives in room.

## 2018-11-07 NOTE — ED Triage Notes (Signed)
Pt presents to ED via wheelchair from [facility] with c/o facial droop to L eye. Pt states eye drooping began Friday 8/7 and has progressively gotten worse. Pt states is currently being treated for lung cancer. Pt states last chemo 2 weeks ago today. Pt c/o pain to L side of her chest, where she has largest tumor.

## 2018-11-08 ENCOUNTER — Inpatient Hospital Stay: Payer: Medicaid Other

## 2018-11-08 DIAGNOSIS — H02402 Unspecified ptosis of left eyelid: Principal | ICD-10-CM

## 2018-11-08 DIAGNOSIS — E871 Hypo-osmolality and hyponatremia: Secondary | ICD-10-CM

## 2018-11-08 LAB — BASIC METABOLIC PANEL
Anion gap: 9 (ref 5–15)
BUN: 17 mg/dL (ref 8–23)
CO2: 26 mmol/L (ref 22–32)
Calcium: 8.9 mg/dL (ref 8.9–10.3)
Chloride: 100 mmol/L (ref 98–111)
Creatinine, Ser: 0.93 mg/dL (ref 0.44–1.00)
GFR calc Af Amer: >60 mL/min (ref >60)
GFR calc non Af Amer: >60 mL/min (ref >60)
Glucose, Bld: 215 mg/dL — ABNORMAL HIGH (ref 70–99)
Potassium: 3.9 mmol/L (ref 3.5–5.1)
Sodium: 135 mmol/L (ref 135–145)

## 2018-11-08 LAB — CBC
HCT: 29.2 % — ABNORMAL LOW (ref 36.0–46.0)
Hemoglobin: 9.9 g/dL — ABNORMAL LOW (ref 12.0–15.0)
MCH: 32.8 pg (ref 26.0–34.0)
MCHC: 33.9 g/dL (ref 30.0–36.0)
MCV: 96.7 fL (ref 80.0–100.0)
Platelets: 120 10*3/uL — ABNORMAL LOW (ref 150–400)
RBC: 3.02 MIL/uL — ABNORMAL LOW (ref 3.87–5.11)
RDW: 12.4 % (ref 11.5–15.5)
WBC: 4 10*3/uL (ref 4.0–10.5)
nRBC: 0 % (ref 0.0–0.2)

## 2018-11-08 LAB — GLUCOSE, CAPILLARY
Glucose-Capillary: 213 mg/dL — ABNORMAL HIGH (ref 70–99)
Glucose-Capillary: 273 mg/dL — ABNORMAL HIGH (ref 70–99)
Glucose-Capillary: 205 mg/dL — ABNORMAL HIGH (ref 70–99)

## 2018-11-08 MED ORDER — HEPARIN SOD (PORK) LOCK FLUSH 100 UNIT/ML IV SOLN
INTRAVENOUS | Status: AC
  Filled 2018-11-08: qty 5

## 2018-11-08 MED ORDER — OXYCODONE HCL 5 MG PO TABS: 5.0000 mg | ORAL_TABLET | Freq: Three times a day (TID) | ORAL | 0 refills | Status: AC | PRN

## 2018-11-08 MED ORDER — OXYCODONE HCL 5 MG PO TABS
5.0000 mg | ORAL_TABLET | Freq: Four times a day (QID) | ORAL | Status: DC | PRN
  Administered 2018-11-08 (×2): 5 mg via ORAL
  Filled 2018-11-08 (×2): qty 1

## 2018-11-08 MED ORDER — OXYCODONE HCL 5 MG PO TABS: 5.0000 mg | ORAL_TABLET | Freq: Three times a day (TID) | ORAL | 0 refills | Status: DC | PRN

## 2018-11-08 MED ORDER — HEPARIN SOD (PORK) LOCK FLUSH 100 UNIT/ML IV SOLN
500.0000 [IU] | Freq: Once | INTRAVENOUS | Status: AC
  Administered 2018-11-08: 20:00:00 500 [IU] via INTRAVENOUS

## 2018-11-08 MED ORDER — ENOXAPARIN SODIUM 40 MG/0.4ML ~~LOC~~ SOLN
40.0000 mg | SUBCUTANEOUS | Status: DC
  Administered 2018-11-08: 40 mg via SUBCUTANEOUS
  Filled 2018-11-08: qty 0.4

## 2018-11-08 MED ORDER — MORPHINE SULFATE (PF) 2 MG/ML IV SOLN
2.0000 mg | INTRAVENOUS | Status: DC | PRN
  Administered 2018-11-08: 2 mg via INTRAVENOUS
  Filled 2018-11-08: qty 1

## 2018-11-08 MED ORDER — GADOBUTROL 1 MMOL/ML IV SOLN
7.0000 mL | Freq: Once | INTRAVENOUS | Status: AC | PRN
  Administered 2018-11-08: 7 mL via INTRAVENOUS

## 2018-11-08 NOTE — Progress Notes (Signed)
OXycodone script given per dr.Sudinis request

## 2018-11-08 NOTE — Progress Notes (Signed)
Inpatient Diabetes Program Recommendations  AACE/ADA: New Consensus Statement on Inpatient Glycemic Control   Target Ranges:  Prepandial:   less than 140 mg/dL      Peak postprandial:   less than 180 mg/dL (1-2 hours)      Critically ill patients:  140 - 180 mg/dL  Results for Robin, Moody (MRN [id number]) as of 11/08/2018 11:14  Ref. Range 11/07/2018 13:20 11/07/2018 22:20 11/08/2018 07:22  Glucose-Capillary Latest Ref Range: 70 - 99 mg/dL 190 (H) 219 (H) 213 (H)   Results for Robin, Moody (MRN [id number]) as of 11/08/2018 11:14  Ref. Range 11/07/2018 13:21  Hemoglobin A1C Latest Ref Range: 4.8 - 5.6 % 9.7 (H)   Review of Glycemic Control  Diabetes history: DM2 Outpatient Diabetes medications: Lantus 50 units daily Current orders for Inpatient glycemic control: Lantus 50 units daily, Novolog 0-9 units TID with meals, Novolog 0-5 units QHS  Inpatient Diabetes Program Recommendations:   HbgA1C: A1C 9.7% on 11/07/18 indicating an average glucose of 232 mg/dl over the past 2-3 months. Prior A1C in Care Everywhere was 10.5% on 10/16/18.  NOTE: Spoke with patient over the phone about diabetes and home regimen for diabetes control. Patient reports being followed by Dr. Camillo Flaming (Endocrinologist) for diabetes management and had a televisit on 07/04/18.  Patient reports that she is taking Lantus 50 units daily for DM control. Patient reports that she use to take Victoza but it was stopped in April by Dr. Camillo Flaming due to poor appetite and recent dx of lung cancer.  Patient reports her glucose ranges from mid 100's to 200's mg/dl.  Discussed A1C results (9.7% on 11/07/18) and explained that current A1C indicates an average glucose of 232 mg/dl over the past 2-3 months. Discussed glucose and A1C goals. Discussed importance of checking CBGs and maintaining good CBG control to prevent long-term and short-term complications. Patient states that she plans to get restarted on the Victoza since her appetite is improved.  Explained that if she restarts the Victoza, she may need adjustments with the Lantus. Asked patient to call Dr. Camillo Flaming to see if she can make an appointment for DM follow up to inquire about restarting Victoza and Lantus adjustments if necessary.  Patient verbalized understanding of information discussed and reports no further questions at this time related to diabetes.  Thanks, Barnie Alderman, RN, MSN, CDE Diabetes Coordinator Inpatient Diabetes Program [phone] (Team Pager)

## 2018-11-08 NOTE — Progress Notes (Signed)
MRI results still not available

## 2018-11-08 NOTE — Discharge Instructions (Signed)
Hyponatremia Hyponatremia is when the amount of salt (sodium) in your blood is too low. When salt levels are low, your body may take in extra water. This can cause swelling throughout the body. The swelling often affects the brain. What are the causes? This condition may be caused by:  Certain medical problems or conditions.  Vomiting a lot.  Having watery poop (diarrhea) often.  Certain medicines or illegal drugs.  Not having enough water in the body (dehydration).  Drinking too much water.  Eating a diet that is low in salt.  Large burns on your body.  Too much sweating. What increases the risk? You are more likely to get this condition if you:  Have long-term (chronic) kidney disease.  Have heart failure.  Have a medical condition that causes you to have watery poop often.  Do very hard exercises.  Take medicines that affect the amount of salt is in your blood. What are the signs or symptoms? Symptoms of this condition include:  Headache.  Feeling like you may vomit (nausea).  Vomiting.  Being very tired (lethargic).  Muscle weakness and cramps.  Not wanting to eat as much as normal (loss of appetite).  Feeling weak or light-headed. Severe symptoms of this condition include:  Confusion.  Feeling restless (agitation).  Having a fast heart rate.  Passing out (fainting).  Seizures.  Coma. How is this treated? Treatment for this condition depends on the cause. Treatment may include:  Getting fluids through an IV tube that is put into one of your veins.  Taking medicines to fix the salt levels in your blood. If medicines are causing the problem, your medicines will need to be changed.  Limiting how much water or fluid you take in.  Monitoring in the hospital to watch your symptoms. Follow these instructions at home:   Take over-the-counter and prescription medicines only as told by your doctor. Many medicines can make this condition worse.  Talk with your doctor about any medicines that you are taking.  Eat and drink exactly as you are told by your doctor. ? Eat only the foods you are told to eat. ? Limit how much fluid you take.  Do not drink alcohol.  Keep all follow-up visits as told by your doctor. This is important. Contact a doctor if:  You feel more like you may vomit.  You feel more tired.  Your headache gets worse.  You feel more confused.  You feel weaker.  Your symptoms go away and then they come back.  You have trouble following the diet instructions. Get help right away if:  You have a seizure.  You pass out.  You keep having watery poop.  You keep vomiting. Summary  Hyponatremia is when the amount of salt in your blood is too low.  When salt levels are low, you can have swelling throughout the body. The swelling mostly affects the brain.  Treatment depends on the cause. Treatment may include getting IV fluids, medicines, or not drinking as much fluid. This information is not intended to replace advice given to you by your health care provider. Make sure you discuss any questions you have with your health care provider. Document Released: 11/25/2010 Document Revised: 06/01/2018 Document Reviewed: 02/16/2018 Elsevier Patient Education  2020 [employer].

## 2018-11-08 NOTE — Consult Note (Signed)
Reason for Consult:Left ptosis Referring Physician: Sudini  CC: Left eyelid droop  HPI: Robin Moody is an 63 y.o. female who reports that on last Friday she noted that her left eyelid was drooping.  She had no change in vision.  No noted diplopia.  Symptoms have remained the same and not progressed.  Reports some generalized weakness as well over the past couple of days.   Patient diagnosed with lung cancer.  Type and stage not specified.  Patient on chemotherapy, last received two weeks ago.     Past Medical History:  Diagnosis Date  . Anxiety   . Cancer ([place])   . Chronic diarrhea Noted at 04/29/15 [facility] Visit   Pt reports having it for 5 years.  . Diabetes mellitus without complication ([place])   . GERD (gastroesophageal reflux disease)   . Hepatitis C   . Hypertension     Past Surgical History:  Procedure Laterality Date  . FINGER SURGERY  Sept 2016   Surgical repair of 4th digit on left hand    Family History  Problem Relation Age of Onset  . Hypertension Mother     Social History:  reports that she has been smoking cigarettes. She has never used smokeless tobacco. She reports previous alcohol use. She reports that she does not use drugs.  No Known Allergies  Medications:  I have reviewed the patient's current medications. Prior to Admission:  Medications Prior to Admission  Medication Sig Dispense Refill Last Dose  . acetaminophen (TYLENOL) 500 MG tablet Take 500 mg by mouth every 8 (eight) hours as needed.   prn at prn  . amLODipine (NORVASC) 5 MG tablet TAKE ONE TABLET BY MOUTH EVERY DAY (Patient taking differently: Take 10 mg by mouth daily. Taking one PO daily) 90 tablet 0 11/07/2018 at Unknown time  . atorvastatin (LIPITOR) 20 MG tablet Take 20 mg by mouth daily.    11/07/2018 at 0900  . citalopram (CELEXA) 20 MG tablet Take 20 mg by mouth daily.   11/07/2018 at 0900  . famotidine (PEPCID) 20 MG tablet Take 20 mg by mouth daily.   11/07/2018 at 0900  .  Insulin Glargine (LANTUS Abbott) Inject 50 Units into the skin daily.    11/07/2018 at 0900  . lisinopril (PRINIVIL,ZESTRIL) 20 MG tablet Take 1 tablet (20 mg total) by mouth daily. (Patient taking differently: Take 5 mg by mouth daily. ) 180 tablet 0 11/07/2018 at Unknown time  . metoprolol succinate (TOPROL-XL) 100 MG 24 hr tablet Take 100 mg by mouth daily.   11/07/2018 at Unknown time  . mirtazapine (REMERON) 30 MG tablet Take 30 mg by mouth at bedtime.   11/06/2018 at Unknown time  . Multiple Vitamin (MULTIVITAMIN) tablet Take 1 tablet by mouth daily.   11/07/2018 at 0900  . ondansetron (ZOFRAN) 8 MG tablet Take 8 mg by mouth every 8 (eight) hours as needed for nausea or vomiting. TAKE 2 TABLETS AS NEEDED FOR NAUSEA AND VOMITING    prn at prn  . pantoprazole (PROTONIX) 40 MG tablet Take 40 mg by mouth daily.   11/07/2018 at 0900  . prochlorperazine (COMPAZINE) 10 MG tablet Take 10 mg by mouth every 6 (six) hours as needed for nausea or vomiting.   prn at prn   Scheduled: . amLODipine  10 mg Oral QHS  . atorvastatin  20 mg Oral Daily  . citalopram  20 mg Oral Daily  . famotidine  20 mg Oral QHS  .  heparin  5,000 Units Subcutaneous Q8H  . insulin aspart  0-5 Units Subcutaneous QHS  . insulin aspart  0-9 Units Subcutaneous TID WC  . insulin glargine  50 Units Subcutaneous Daily  . lisinopril  5 mg Oral Daily  . metoprolol succinate  100 mg Oral Daily  . mirtazapine  30 mg Oral QHS  . multivitamin with minerals  1 tablet Oral Daily  . pantoprazole  40 mg Oral Daily  . sodium chloride flush  3 mL Intravenous Once    ROS: History obtained from the patient  General ROS: negative for - chills, fatigue, fever, night sweats, weight gain or weight loss Psychological ROS: negative for - behavioral disorder, hallucinations, memory difficulties, mood swings or suicidal ideation Ophthalmic ROS: as noted in HPI ENT ROS: negative for - epistaxis, nasal discharge, oral lesions, sore throat, tinnitus or  vertigo Allergy and Immunology ROS: negative for - hives or itchy/watery eyes Hematological and Lymphatic ROS: negative for - bleeding problems, bruising or swollen lymph nodes Endocrine ROS: negative for - galactorrhea, hair pattern changes, polydipsia/polyuria or temperature intolerance Respiratory ROS: negative for - cough, hemoptysis, shortness of breath or wheezing Cardiovascular ROS: chest pain Gastrointestinal ROS: diarrhea Genito-Urinary ROS: negative for - dysuria, hematuria, incontinence or urinary frequency/urgency Musculoskeletal ROS: weakness Neurological ROS: as noted in HPI Dermatological ROS: negative for rash and skin lesion changes  Physical Examination: Blood pressure (!) 156/80, pulse 77, temperature 98.1 F (36.7 C), temperature source Oral, resp. rate 20, height 5\' 4"  (1.626 m), weight 75.8 kg, last menstrual period 01/09/2001, SpO2 100 %.  HEENT-  Normocephalic, no lesions, without obvious abnormality.  Normal external eye and conjunctiva.  Normal TM's bilaterally.  Normal auditory canals and external ears. Normal external nose, mucus membranes and septum.  Normal pharynx. Cardiovascular- S1, S2 normal, pulses palpable throughout   Lungs- decreased breath sounds Abdomen- soft, non-tender; bowel sounds normal; no masses,  no organomegaly Extremities- no edema Lymph-no adenopathy palpable Musculoskeletal-no joint tenderness, deformity or swelling Skin-warm and dry, no hyperpigmentation, vitiligo, or suspicious lesions  Neurological Examination   Mental Status: Alert, oriented, thought content appropriate.  Speech fluent without evidence of aphasia.  Able to follow 3 step commands without difficulty. Cranial Nerves: II: Visual fields grossly normal, pupils equal, round, reactive to light and accommodation III,IV, VI: left ptosis, extra-ocular motions intact bilaterally V,VII: smile symmetric, facial light touch sensation normal bilaterally VIII: hearing normal  bilaterally IX,X: gag reflex present XI: bilateral shoulder shrug XII: midline tongue extension Motor: Right : Upper extremity   5/5    Left:     Upper extremity   5/5  Lower extremity   5/5     Lower extremity   5/5 Tone and bulk:normal tone throughout; no atrophy noted Sensory: Pinprick and light touch intact throughout, bilaterally Deep Tendon Reflexes: 1+ and symmetric with absent AJ's bilaterally Plantars: Right: downgoing   Left: downgoing Cerebellar: Normal finger-to-nose and normal heel-to-shin testing bilaterally Gait: normal  Laboratory Studies:   Basic Metabolic Panel: Recent Labs  Lab 11/07/18 1321 11/08/18 0621  NA 126* 135  K 4.3 3.9  CL 89* 100  CO2 28 26  GLUCOSE 190* 215*  BUN 24* 17  CREATININE 1.04* 0.93  CALCIUM 9.4 8.9    Liver Function Tests: Recent Labs  Lab 11/07/18 1321  AST 18  ALT 15  ALKPHOS 65  BILITOT 0.4  PROT 7.3  ALBUMIN 4.0   No results for input(s): LIPASE, AMYLASE in the last 168 hours. No results  for input(s): AMMONIA in the last 168 hours.  CBC: Recent Labs  Lab 11/07/18 1321 11/08/18 0621  WBC 5.6 4.0  NEUTROABS 3.7  --   HGB 10.4* 9.9*  HCT 30.2* 29.2*  MCV 96.2 96.7  PLT 124* 120*    Cardiac Enzymes: No results for input(s): CKTOTAL, CKMB, CKMBINDEX, TROPONINI in the last 168 hours.  BNP: Invalid input(s): POCBNP  CBG: Recent Labs  Lab 11/07/18 1320 11/07/18 2220 11/08/18 0722  GLUCAP 190* 219* 213*    Microbiology: Results for orders placed or performed during the hospital encounter of 11/07/18  SARS Coronavirus 2 [facility] order, Performed in [facility] hospital lab) Nasopharyngeal Nasopharyngeal Swab     Status: None   Collection Time: 11/07/18  1:53 PM   Specimen: Nasopharyngeal Swab  Result Value Ref Range Status   SARS Coronavirus 2 NEGATIVE NEGATIVE Final    Comment: (NOTE) If result is NEGATIVE SARS-CoV-2 target nucleic acids are NOT DETECTED. The SARS-CoV-2 RNA is generally  detectable in upper and lower  respiratory specimens during the acute phase of infection. The lowest  concentration of SARS-CoV-2 viral copies this assay can detect is 250  copies / mL. A negative result does not preclude SARS-CoV-2 infection  and should not be used as the sole basis for treatment or other  patient management decisions.  A negative result may occur with  improper specimen collection / handling, submission of specimen other  than nasopharyngeal swab, presence of viral mutation(s) within the  areas targeted by this assay, and inadequate number of viral copies  (<250 copies / mL). A negative result must be combined with clinical  observations, patient history, and epidemiological information. If result is POSITIVE SARS-CoV-2 target nucleic acids are DETECTED. The SARS-CoV-2 RNA is generally detectable in upper and lower  respiratory specimens dur ing the acute phase of infection.  Positive  results are indicative of active infection with SARS-CoV-2.  Clinical  correlation with patient history and other diagnostic information is  necessary to determine patient infection status.  Positive results do  not rule out bacterial infection or co-infection with other viruses. If result is PRESUMPTIVE POSTIVE SARS-CoV-2 nucleic acids MAY BE PRESENT.   A presumptive positive result was obtained on the submitted specimen  and confirmed on repeat testing.  While 2019 novel coronavirus  (SARS-CoV-2) nucleic acids may be present in the submitted sample  additional confirmatory testing may be necessary for epidemiological  and / or clinical management purposes  to differentiate between  SARS-CoV-2 and other Sarbecovirus currently known to infect humans.  If clinically indicated additional testing with an alternate test  methodology [phone]) is advised. The SARS-CoV-2 RNA is generally  detectable in upper and lower respiratory sp ecimens during the acute  phase of infection. The  expected result is Negative. Fact Sheet for Patients:  StrictlyIdeas.no Fact Sheet for Healthcare Providers: BankingDealers.co.za This test is not yet approved or cleared by the [place] FDA and has been authorized for detection and/or diagnosis of SARS-CoV-2 by FDA under an Emergency Use Authorization (EUA).  This EUA will remain in effect (meaning this test can be used) for the duration of the COVID-19 declaration under Section 564(b)(1) of the Act, 21 U.S.C. section 360bbb-3(b)(1), unless the authorization is terminated or revoked sooner. Performed at [facility], [address], [place], [place] [address]     Coagulation Studies: Recent Labs    11/07/18 1321  LABPROT 13.0  INR 1.0    Urinalysis:  Recent Labs  Lab  11/07/18 1350  COLORURINE YELLOW*  LABSPEC 1.009  PHURINE 7.0  GLUCOSEU 50*  HGBUR NEGATIVE  BILIRUBINUR NEGATIVE  KETONESUR NEGATIVE  PROTEINUR NEGATIVE  NITRITE NEGATIVE  LEUKOCYTESUR TRACE*    Lipid Panel:     Component Value Date/Time   CHOL 184 03/02/2016 1823   TRIG 144 03/02/2016 1823   HDL 60 03/02/2016 1823   CHOLHDL 3.1 03/02/2016 1823   LDLCALC 95 03/02/2016 1823    HgbA1C:  Lab Results  Component Value Date   HGBA1C 9.7 (H) 11/07/2018    Urine Drug Screen:  No results found for: LABOPIA, COCAINSCRNUR, LABBENZ, AMPHETMU, THCU, LABBARB  Alcohol Level: No results for input(s): ETH in the last 168 hours.  Other results: EKG: normal sinus rhythm at 66 bpm.  Imaging: Dg Chest 1 View  Result Date: 11/07/2018 CLINICAL DATA:  Left-sided chest pain. History of lung cancer. Possible foreign body seen on frontal chest x-ray from same day. EXAM: CHEST  1 VIEW COMPARISON:  Chest x-ray from same day. FINDINGS: Small round radiopaque density projecting over the left lower lobe bronchus on the previous frontal chest x-ray is external to the patient along the anterior chest wall on  the lateral view. IMPRESSION: Small round radiopaque density seen on the prior frontal chest x-ray is external to the patient. Electronically Signed   By: Titus Dubin M.D.   On: 11/07/2018 15:22   Ct Head Wo Contrast  Result Date: 11/07/2018 CLINICAL DATA:  Left-sided facial droop.  History of lung carcinoma EXAM: CT HEAD WITHOUT CONTRAST TECHNIQUE: Contiguous axial images were obtained from the base of the skull through the vertex without intravenous contrast. COMPARISON:  None. FINDINGS: Brain: There is age related volume loss. There is no evident intracranial mass, hemorrhage, extra-axial fluid collection, or midline shift. There is patchy small vessel disease throughout the centra semiovale bilaterally. There is no appreciable acute infarct. Vascular: There is no hyperdense vessel. There is calcification in each carotid siphon region. Skull: Bony calvarium appears intact. Sinuses/Orbits: Visualized paranasal sinuses are clear. Visualized orbits appear symmetric bilaterally. Other: Mastoid air cells are clear. There is debris in the left external auditory canal. IMPRESSION: Patchy periventricular small vessel disease. No acute infarct. No mass or hemorrhage. There are foci of arterial vascular calcification. There is probable cerumen in the left external auditory canal. Electronically Signed   By: Lowella Grip III M.D.   On: 11/07/2018 13:49   Dg Chest Portable 1 View  Result Date: 11/07/2018 CLINICAL DATA:  Left-sided chest pain EXAM: PORTABLE CHEST 1 VIEW COMPARISON:  Radiograph 05/18/2008 FINDINGS: Coarse interstitial changes are present throughout the lungs. Nodular opacity present in the left upper lung. Partial collapse of the left upper lobe possibly related to underlying mass lesion given history of lung malignancy. No other consolidative opacities. Pulmonary vascularity is normal. Indeterminate rounded density projects over the left chest, in the region of the left lower lobe bronchus.  Unclear if this could reflect an endobronchial foreign body. Accessed right IJ approach port catheter tip terminates near the superior cavoatrial junction. The bones are diffusely demineralized. No other acute osseous or soft tissue abnormality. IMPRESSION: 1. Partial collapse of the left upper lobe possibly related to underlying mass lesion given history of lung malignancy. 2. Indeterminate rounded density projecting over the left chest, in the region of the left lower lobe bronchus. Unclear if this could reflect an endobronchial foreign body. Correlate with patient history and symptomatology. Consider lateral radiograph for further investigation. These results were called by telephone  at the time of interpretation on 11/07/2018 at 2:29 pm to Dr. Merlyn Lot , who verbally acknowledged these results. Electronically Signed   By: Lovena Le M.D.   On: 11/07/2018 14:29     Assessment/Plan: 63 year old female with lung cancer presenting with left ptosis and complaints of generalized weakness.  No extraocular abnormalities noted.  Pupils symmetric and reactive.  Head CT reviewed and shows no acute changes. No mass lesions noted. Can not rule out carcinomatous disease, aneurysm or Rita Ohara.  Further work up recommended.  Patient wishes to go home today.  Would therefore perform imaging and patient may follow up with her oncologist for determination for need of LP and referral for NCV/EMG testing.  Recommendations: 1. MRI of the brain with and without contrast 2. MRA of the brain   Alexis Goodell, MD Neurology [phone] 11/08/2018, 10:40 AM

## 2018-11-08 NOTE — Plan of Care (Signed)
  Problem: Education: Goal: Knowledge of General Education information will improve Description: Including pain rating scale, medication(s)/side effects and non-pharmacologic comfort measures Outcome: Progressing   Problem: Safety: Goal: Ability to remain free from injury will improve Outcome: Progressing   Problem: Education: Goal: Knowledge of disease or condition will improve Outcome: Progressing Goal: Knowledge of secondary prevention will improve Outcome: Progressing Goal: Knowledge of patient specific risk factors addressed and post discharge goals established will improve Outcome: Progressing   Problem: Coping: Goal: Will verbalize positive feelings about self Outcome: Progressing Goal: Will identify appropriate support needs Outcome: Progressing

## 2018-11-08 NOTE — Progress Notes (Signed)
Pt  Left at this time for home with sister. No voiced c/o. A/o. No distress/ discharge  Instructions earlier to pt. presc given and that and home meds discussed along with diet / activity / f/u. Port flushed and deaccessed per protocol good blood return noted/

## 2018-11-09 LAB — HIV ANTIBODY (ROUTINE TESTING W REFLEX): HIV Screen 4th Generation wRfx: NONREACTIVE

## 2018-11-10 NOTE — Discharge Summary (Signed)
[facility] NAME: Robin Moody    MR#:  [id number]  DATE OF BIRTH:  [birth year]  DATE OF ADMISSION:  11/07/2018 ADMITTING PHYSICIAN: Vaughan Basta, MD  DATE OF DISCHARGE: 11/08/2018  8:54 PM  PRIMARY CARE PHYSICIAN: Gale Journey, MD   ADMISSION DIAGNOSIS:  Acute hyponatremia [E87.1] Facial droop [R29.810]  DISCHARGE DIAGNOSIS:  Principal Problem:   Hyponatremia Active Problems:   Ptosis  SECONDARY DIAGNOSIS:   Past Medical History:  Diagnosis Date  . Anxiety   . Cancer ([place])   . Chronic diarrhea Noted at 04/29/15 [facility] Visit   Pt reports having it for 5 years.  . Diabetes mellitus without complication ([place])   . GERD (gastroesophageal reflux disease)   . Hepatitis C   . Hypertension      ADMITTING HISTORY  HISTORY OF PRESENT ILLNESS: Robin Moody  is a 63 y.o. female with a known history of anxiety, lung cancer, gastroesophageal reflux disease, diabetes, hepatitis C, hypertension-received last chemotherapy 2 weeks ago.  Since last week Friday she has drooping of her left eye lid.  Which has got worse in last 2 days.  She also have generalized weakness in last 2 days.  She denies any associated headache, double vision, speech problem, facial numbness or weakness, focal weakness or numbness on any of her limbs, dizziness, nausea or vomiting. Concerned with worsening complaint she decided to come to emergency room today.  She was noted no new findings on CT scan of the head.  Her sodium level was 126 which was running normal in the past.  With this ER physician suggested to admit to hospitalist service.  When I went to see her earlier she refused to get admitted so I spoke to ER physician who took over from the first team.  Later after explanation she agreed to get admitted and I have admitted the patient.  HOSPITAL COURSE:   *Left eye ptosis.  Patient was admitted to medical floor with CVA order set.  She  had MRI of the brain done which showed no stroke or masses.  Patient symptoms persisted.  Neurology consulted.  Patient wanted to have outpatient work-up and she was discharged home to follow-up with oncology and neurology as outpatient.  No vision loss.  No other focal deficits.  Afebrile. No aneurysms on MRA  Differentials of carcinomatosis disease, Eaton-Lambert were also considered  Patient discharged home in stable condition  CONSULTS OBTAINED:  Treatment Team:  Catarina Hartshorn, MD  DRUG ALLERGIES:  No Known Allergies  DISCHARGE MEDICATIONS:   Allergies as of 11/08/2018   No Known Allergies     Medication List    TAKE these medications   acetaminophen 500 MG tablet Commonly known as: TYLENOL Take 500 mg by mouth every 8 (eight) hours as needed.   amLODipine 5 MG tablet Commonly known as: NORVASC TAKE ONE TABLET BY MOUTH EVERY DAY What changed:   how much to take  additional instructions   atorvastatin 20 MG tablet Commonly known as: LIPITOR Take 20 mg by mouth daily.   citalopram 20 MG tablet Commonly known as: CELEXA Take 20 mg by mouth daily.   famotidine 20 MG tablet Commonly known as: PEPCID Take 20 mg by mouth daily.   LANTUS [place] Inject 50 Units into the skin daily.   lisinopril 20 MG tablet Commonly known as: ZESTRIL Take 1 tablet (20 mg total) by mouth daily. What changed: how much to take   metoprolol succinate  100 MG 24 hr tablet Commonly known as: TOPROL-XL Take 100 mg by mouth daily.   mirtazapine 30 MG tablet Commonly known as: REMERON Take 30 mg by mouth at bedtime.   multivitamin tablet Take 1 tablet by mouth daily.   ondansetron 8 MG tablet Commonly known as: ZOFRAN Take 8 mg by mouth every 8 (eight) hours as needed for nausea or vomiting. TAKE 2 TABLETS AS NEEDED FOR NAUSEA AND VOMITING   oxyCODONE 5 MG immediate release tablet Commonly known as: Oxy IR/ROXICODONE Take 1 tablet (5 mg total) by mouth 3 (three) times daily as  needed for severe pain.   pantoprazole 40 MG tablet Commonly known as: PROTONIX Take 40 mg by mouth daily.   prochlorperazine 10 MG tablet Commonly known as: COMPAZINE Take 10 mg by mouth every 6 (six) hours as needed for nausea or vomiting.       Today   VITAL SIGNS:  Blood pressure (!) 150/86, pulse 73, temperature 98.4 F (36.9 C), resp. rate 20, height 5\' 4"  (1.626 m), weight 75.8 kg, last menstrual period 01/09/2001, SpO2 100 %.  I/O:  No intake or output data in the 24 hours ending 11/10/18 1739  PHYSICAL EXAMINATION:  Physical Exam  GENERAL:  63 y.o.-year-old patient lying in the bed with no acute distress.  LUNGS: Normal breath sounds bilaterally, no wheezing, rales,rhonchi or crepitation. No use of accessory muscles of respiration.  CARDIOVASCULAR: S1, S2 normal. No murmurs, rubs, or gallops.  ABDOMEN: Soft, non-tender, non-distended. Bowel sounds present. No organomegaly or mass.  NEUROLOGIC: Moves all 4 extremities. PSYCHIATRIC: The patient is alert and oriented x 3.  SKIN: No obvious rash, lesion, or ulcer.   DATA REVIEW:   CBC Recent Labs  Lab 11/08/18 0621  WBC 4.0  HGB 9.9*  HCT 29.2*  PLT 120*    Chemistries  Recent Labs  Lab 11/07/18 1321 11/08/18 0621  NA 126* 135  K 4.3 3.9  CL 89* 100  CO2 28 26  GLUCOSE 190* 215*  BUN 24* 17  CREATININE 1.04* 0.93  CALCIUM 9.4 8.9  AST 18  --   ALT 15  --   ALKPHOS 65  --   BILITOT 0.4  --     Cardiac Enzymes No results for input(s): TROPONINI in the last 168 hours.  Microbiology Results  Results for orders placed or performed during the hospital encounter of 11/07/18  SARS Coronavirus 2 [facility] order, Performed in [facility] hospital lab) Nasopharyngeal Nasopharyngeal Swab     Status: None   Collection Time: 11/07/18  1:53 PM   Specimen: Nasopharyngeal Swab  Result Value Ref Range Status   SARS Coronavirus 2 NEGATIVE NEGATIVE Final    Comment: (NOTE) If result is NEGATIVE SARS-CoV-2  target nucleic acids are NOT DETECTED. The SARS-CoV-2 RNA is generally detectable in upper and lower  respiratory specimens during the acute phase of infection. The lowest  concentration of SARS-CoV-2 viral copies this assay can detect is 250  copies / mL. A negative result does not preclude SARS-CoV-2 infection  and should not be used as the sole basis for treatment or other  patient management decisions.  A negative result may occur with  improper specimen collection / handling, submission of specimen other  than nasopharyngeal swab, presence of viral mutation(s) within the  areas targeted by this assay, and inadequate number of viral copies  (<250 copies / mL). A negative result must be combined with clinical  observations, patient history, and epidemiological information. If  result is POSITIVE SARS-CoV-2 target nucleic acids are DETECTED. The SARS-CoV-2 RNA is generally detectable in upper and lower  respiratory specimens dur ing the acute phase of infection.  Positive  results are indicative of active infection with SARS-CoV-2.  Clinical  correlation with patient history and other diagnostic information is  necessary to determine patient infection status.  Positive results do  not rule out bacterial infection or co-infection with other viruses. If result is PRESUMPTIVE POSTIVE SARS-CoV-2 nucleic acids MAY BE PRESENT.   A presumptive positive result was obtained on the submitted specimen  and confirmed on repeat testing.  While 2019 novel coronavirus  (SARS-CoV-2) nucleic acids may be present in the submitted sample  additional confirmatory testing may be necessary for epidemiological  and / or clinical management purposes  to differentiate between  SARS-CoV-2 and other Sarbecovirus currently known to infect humans.  If clinically indicated additional testing with an alternate test  methodology [phone]) is advised. The SARS-CoV-2 RNA is generally  detectable in upper and lower  respiratory sp ecimens during the acute  phase of infection. The expected result is Negative. Fact Sheet for Patients:  StrictlyIdeas.no Fact Sheet for Healthcare Providers: BankingDealers.co.za This test is not yet approved or cleared by the [place] FDA and has been authorized for detection and/or diagnosis of SARS-CoV-2 by FDA under an Emergency Use Authorization (EUA).  This EUA will remain in effect (meaning this test can be used) for the duration of the COVID-19 declaration under Section 564(b)(1) of the Act, 21 U.S.C. section 360bbb-3(b)(1), unless the authorization is terminated or revoked sooner. Performed at [facility], [address], [place], [place] [address]     RADIOLOGY:  No results found.  Follow up with PCP in 1 week.  Management plans discussed with the patient, family and they are in agreement.  CODE STATUS:  Code Status History    Date Active Date Inactive Code Status Order ID Comments User Context   11/07/2018 1753 11/08/2018 2359 Full Code [id number]  Vaughan Basta, MD ED   Advance Care Planning Activity    Advance Directive Documentation     Most Recent Value  Type of Advance Directive  Healthcare Power of Attorney  Pre-existing out of facility DNR order (yellow form or pink MOST form)  -  "MOST" Form in Place?  -      TOTAL TIME TAKING CARE OF THIS PATIENT ON DAY OF DISCHARGE: more than 30 minutes.   Neita Carp M.D on 11/10/2018 at 5:39 PM  Between 7am to 6pm - Pager - [phone]  After 6pm go to [email or web address] - password [email or web address] [facility]  Office  [phone]  CC: Primary care physician; Gale Journey, MD  Note: This dictation was prepared with Dragon dictation along with smaller phrase technology. Any transcriptional errors that result from this process are unintentional.

## 2020-04-14 IMAGING — MR MRA HEAD WITHOUT CONTRAST
13 of 16 series · 37 of 48 positions shown · IV contrast (gadavist)
Comparison: Head CT 11/07/2018

CLINICAL DATA: Paralysis, facial. Additional history: History of
lung cancer, received last chemotherapy 2 weeks ago, drooping of
left eyelid on [REDACTED].

EXAM:
MRI HEAD WITHOUT AND WITH CONTRAST
MRA HEAD WITHOUT CONTRAST
TECHNIQUE: Multiplanar, multiecho pulse sequences of the brain and surrounding
structures were obtained without and with intravenous contrast.
Angiographic images of the head were obtained using MRA technique
without contrast.
CONTRAST:  7 mL Gadavist

[Series 5: ax dwi_tracew · axial · 3.0mm · 0.60mm/px · z∈[-121,+32]mm · 3 of 48 slices shown]
[im 1/48]
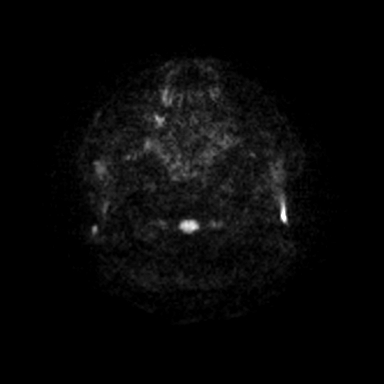
[im 24/48]
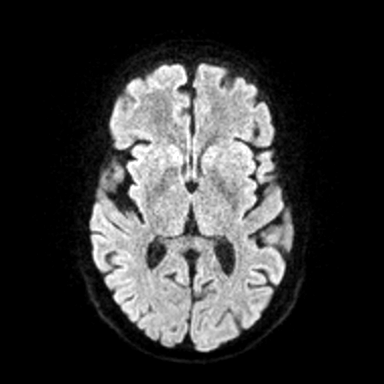
[im 48/48]
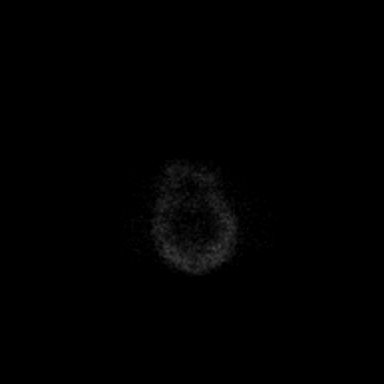

[Series 6: ax dwi_adc · axial · 3.0mm · 0.60mm/px · z∈[-121,+32]mm · 2 of 48 slices shown]
[im 1/48]
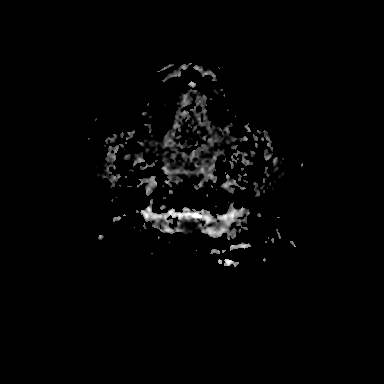
[im 48/48]
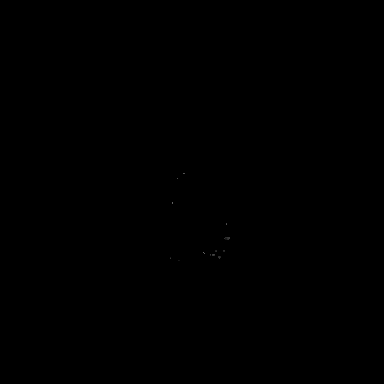

[Series 7: cor dwi_tracew · coronal · 5.0mm · 0.60mm/px · 2 of 40 slices shown]
[im 1/40]
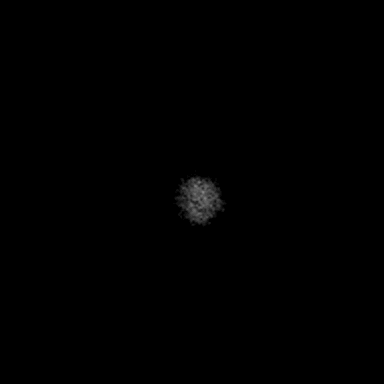
[im 40/40]
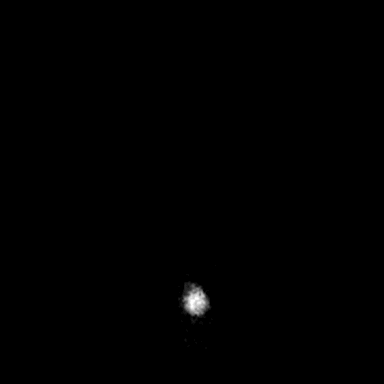

[Series 8: cor dwi_adc · coronal · 5.0mm · 0.60mm/px · 2 of 38 slices shown]
[im 1/38]
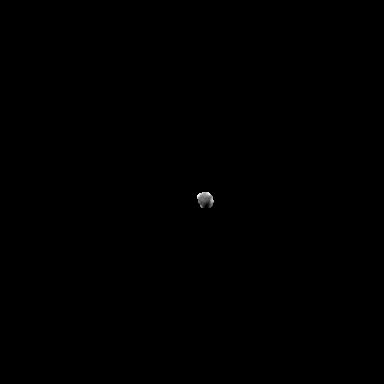
[im 38/38]
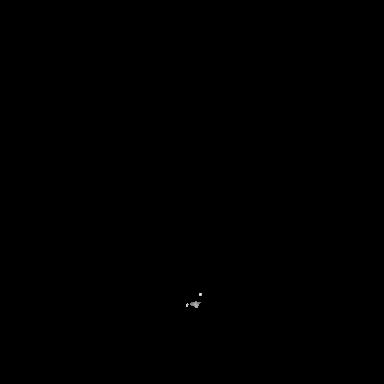

[Series 9: tof_cs_(id) · axial · 0.5mm · 0.48mm/px · z∈[-107,-26]mm · 7 of 200 slices shown]
[im 1/200]
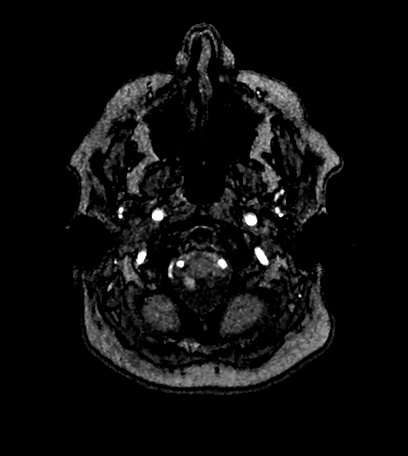
[im 25/200]
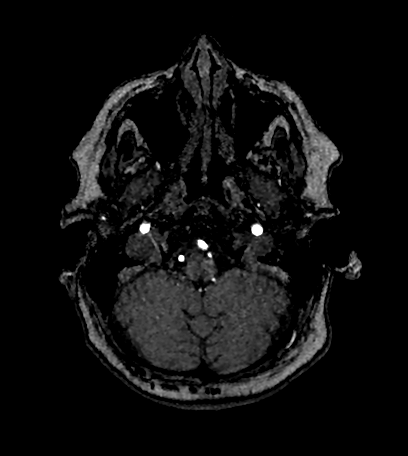
[im 50/200]
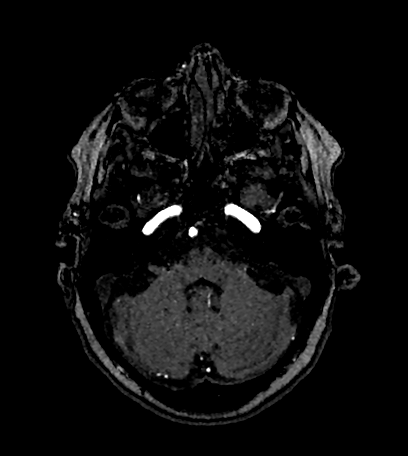
[im 75/200]
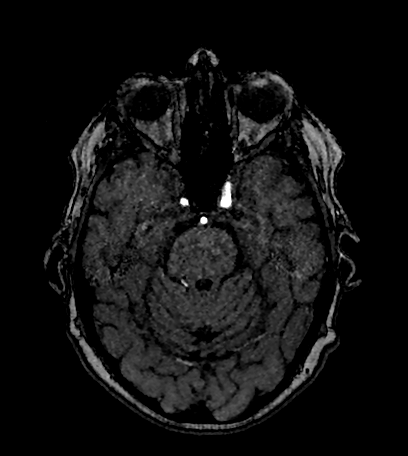
[im 125/200]
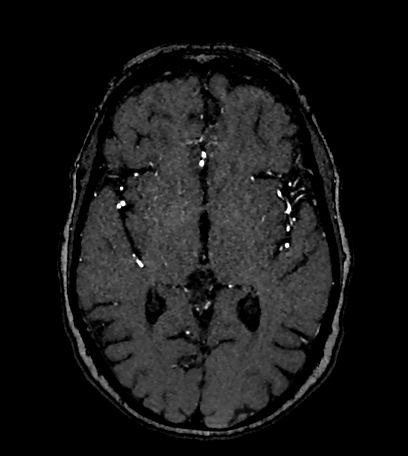
[im 150/200]
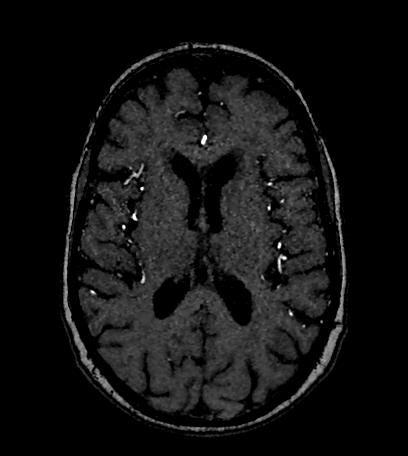
[im 175/200]
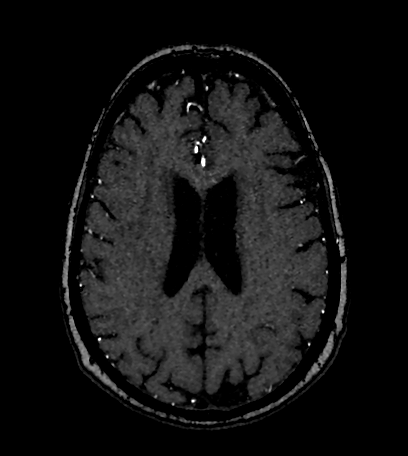

[Series 14: T1 · sagittal · 5.0mm · 0.62mm/px · 1 of 21 slices shown (1 of 2)]
[im 1/21]
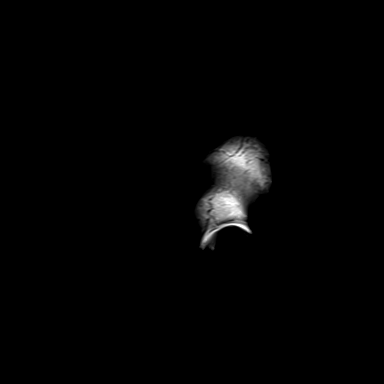

[Series 15: T2 · axial · 5.0mm · 0.53mm/px · 1 of 27 slices shown]
[im 1/27]
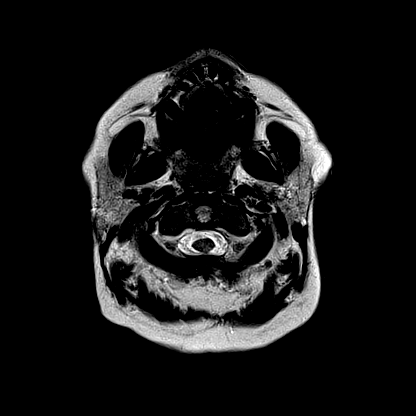

[Series 20: FLAIR · axial · 3.0mm · 0.53mm/px · z∈[-125,+35]mm · 2 of 55 slices shown]
[im 1/55]
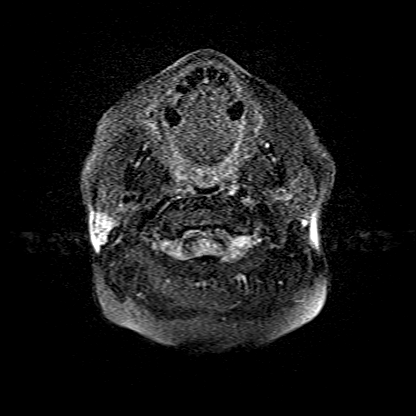
[im 55/55]
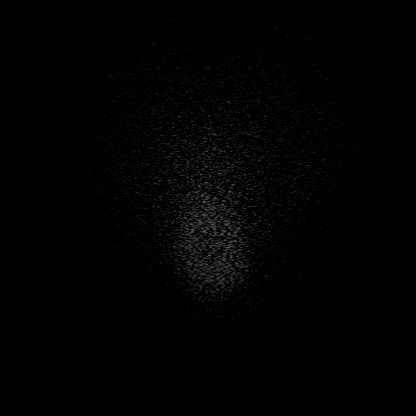

[Series 21: T1 · axial · 1.0mm · 0.98mm/px · z∈[-121,+35]mm · 7 of 160 slices shown (2 of 2)]
[im 1/160]
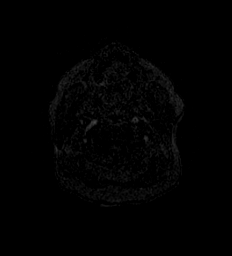
[im 27/160]
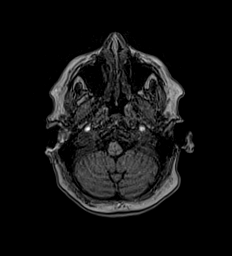
[im 54/160]
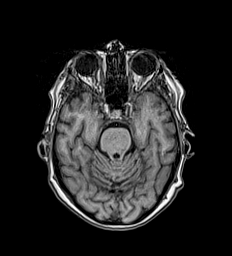
[im 80/160]
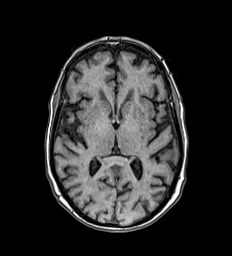
[im 107/160]
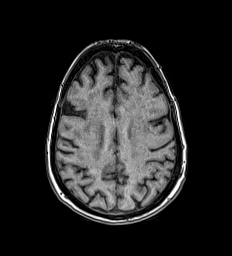
[im 133/160]
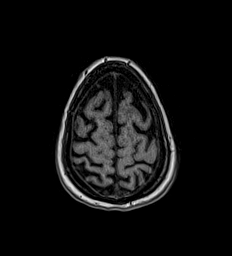
[im 160/160]
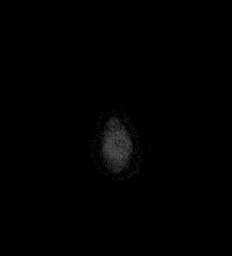

[Series 22: T2 post-contrast · coronal · 5.0mm · 0.57mm/px · 1 of 29 slices shown]
[im 1/29]
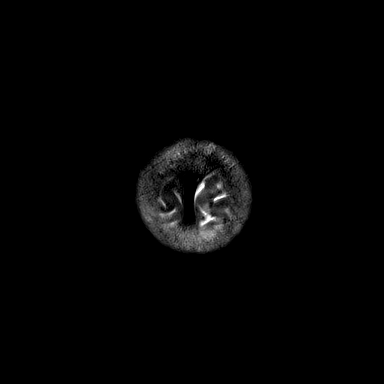

[Series 23: T1 post-contrast · axial · 1.0mm · 0.98mm/px · z∈[-121,+35]mm · 7 of 160 slices shown (1 of 3)]
[im 1/160]
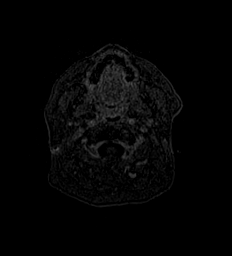
[im 27/160]
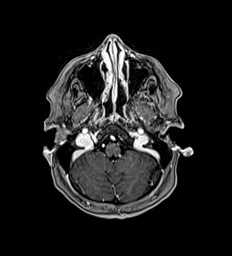
[im 54/160]
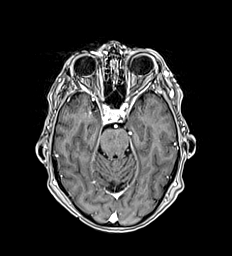
[im 80/160]
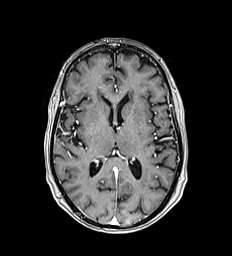
[im 107/160]
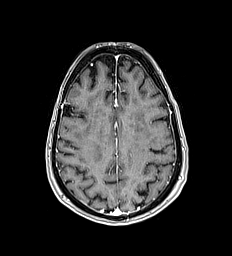
[im 133/160]
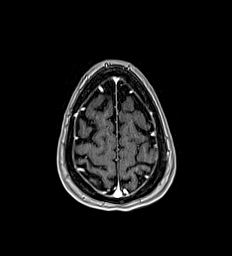
[im 160/160]
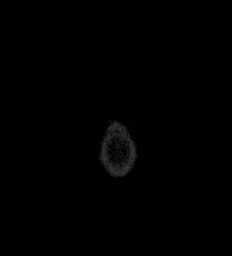

[Series 24: T1 post-contrast · coronal · 5.0mm · 0.90mm/px · 1 of 32 slices shown (2 of 3)]
[im 1/32]
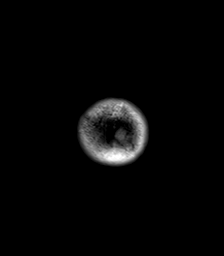

[Series 25: T1 post-contrast · sagittal · 5.0mm · 0.62mm/px · 1 of 21 slices shown (3 of 3)]
[im 1/21]
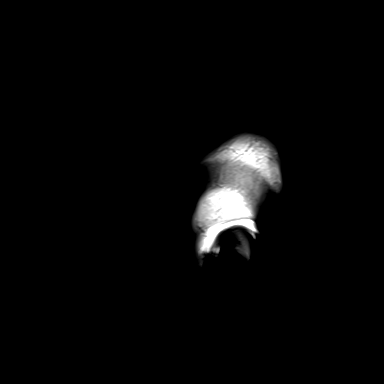

[37 of 48 positions shown; findings below may reference images not displayed]

FINDINGS: MRI HEAD FINDINGS

Brain: No evidence of acute infarct or intracranial hemorrhage. No
midline shift or extra-axial collection. There is nonspecific
advanced scattered T2/FLAIR hyperintensity within the bilateral
cerebral white matter, as well as a small focus within the right
pons. Along the anterior right temporal lobe there are several
punctate and slightly nodular foci of enhancement (series 23, images
54-56). No other abnormal intracranial enhancement is demonstrated.
Cerebral volume is age appropriate.

Vascular: Reported separately.

Skull and upper cervical spine: Normal marrow signal.

Sinuses/Orbits: The imaged globes and orbits demonstrate no acute
abnormality. Trace ethmoid sinus mucosal thickening. No significant
mastoid effusion.

MRA HEAD FINDINGS

The intracranial internal carotid arteries are patent without
significant stenosis. The middle and anterior cerebral arteries are
patent bilaterally without significant proximal stenosis. 1-2 mm
infundibulum at the origin of the left posterior communicating
artery. No intracranial aneurysm is identified.

The distal vertebral arteries and basilar artery are widely patent
without significant stenosis. The bilateral posterior cerebral
arteries are patent without significant proximal stenosis. No
posterior circulation aneurysm is identified. The right posterior
communicating artery is poorly delineated and may be hypoplastic or
absent. A small left posterior communicating artery is present.
IMPRESSION: MRI BRAIN:

- Several punctate, slightly nodular foci of enhancement along the
anterior right temporal lobe. Small metastatic lesions at this site
cannot be excluded, although these foci may be vascular in etiology.
Short-term MRI follow-up is recommended, as clinically warranted.

- Nonspecific advanced cerebral white matter disease. Findings may
reflect chronic small vessel ischemia, possibly with superimposed
treatment related changes.

MRA HEAD:

No large vessel occlusion or proximal high-grade arterial stenosis.

1-2 mm left posterior communicating artery origin infundibulum. No
intracranial aneurysm is identified.

## 2020-06-27 DEATH — deceased
